# Patient Record
Sex: Female | Born: 1985 | Race: White | Hispanic: No | Marital: Single | Smoking: Never smoker
Health system: Southern US, Community
[De-identification: ages and names within clinical notes are randomized; demographics above are authoritative.]

## PROBLEM LIST (undated history)

## (undated) ENCOUNTER — Inpatient Hospital Stay (HOSPITAL_COMMUNITY): Payer: Self-pay

## (undated) DIAGNOSIS — F419 Anxiety disorder, unspecified: Secondary | ICD-10-CM

## (undated) HISTORY — PX: OOPHORECTOMY: SHX86

---
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
## 2008-08-19 ENCOUNTER — Inpatient Hospital Stay (HOSPITAL_COMMUNITY): Admission: AD | Admit: 2008-08-19 | Discharge: 2008-08-19 | Payer: Self-pay | Admitting: Obstetrics and Gynecology

## 2008-08-27 ENCOUNTER — Inpatient Hospital Stay (HOSPITAL_COMMUNITY): Admission: RE | Admit: 2008-08-27 | Discharge: 2008-08-29 | Payer: Self-pay | Admitting: Obstetrics & Gynecology

## 2008-08-30 ENCOUNTER — Encounter: Admission: RE | Admit: 2008-08-30 | Discharge: 2008-09-29 | Payer: Self-pay | Admitting: Obstetrics and Gynecology

## 2008-09-30 ENCOUNTER — Encounter: Admission: RE | Admit: 2008-09-30 | Discharge: 2008-10-15 | Payer: Self-pay | Admitting: Obstetrics and Gynecology

## 2010-03-27 ENCOUNTER — Ambulatory Visit (HOSPITAL_COMMUNITY)
Admission: RE | Admit: 2010-03-27 | Discharge: 2010-03-27 | Disposition: A | Discharge status: Home / Self Care | Payer: BC Managed Care – PPO | Source: Ambulatory Visit | Attending: Emergency Medicine | Admitting: Emergency Medicine

## 2010-03-27 ENCOUNTER — Emergency Department (HOSPITAL_COMMUNITY)
Admission: EM | Admit: 2010-03-27 | Discharge: 2010-03-27 | Disposition: A | Discharge status: Home / Self Care | Payer: BC Managed Care – PPO | Attending: Emergency Medicine | Admitting: Emergency Medicine

## 2010-03-27 DIAGNOSIS — S0003XA Contusion of scalp, initial encounter: Secondary | ICD-10-CM | POA: Insufficient documentation

## 2010-03-27 DIAGNOSIS — M79609 Pain in unspecified limb: Secondary | ICD-10-CM | POA: Insufficient documentation

## 2010-03-27 DIAGNOSIS — S8000XA Contusion of unspecified knee, initial encounter: Secondary | ICD-10-CM | POA: Insufficient documentation

## 2010-03-27 DIAGNOSIS — S60229A Contusion of unspecified hand, initial encounter: Secondary | ICD-10-CM | POA: Insufficient documentation

## 2010-03-27 DIAGNOSIS — M7989 Other specified soft tissue disorders: Secondary | ICD-10-CM | POA: Insufficient documentation

## 2010-03-27 DIAGNOSIS — S0180XA Unspecified open wound of other part of head, initial encounter: Secondary | ICD-10-CM | POA: Insufficient documentation

## 2010-03-27 LAB — URINALYSIS, ROUTINE W REFLEX MICROSCOPIC
Bilirubin Urine: NEGATIVE
Glucose, UA: NEGATIVE mg/dL
Hgb urine dipstick: NEGATIVE
Protein, ur: NEGATIVE mg/dL
Specific Gravity, Urine: 1.015 (ref 1.005–1.030)
pH: 5.5 (ref 5.0–8.0)

## 2010-04-23 LAB — CBC
HCT: 32.1 % — ABNORMAL LOW (ref 36.0–46.0)
Hemoglobin: 8.8 g/dL — ABNORMAL LOW (ref 12.0–15.0)
MCHC: 34.2 g/dL (ref 30.0–36.0)
MCV: 84.6 fL (ref 78.0–100.0)
Platelets: 191 10*3/uL (ref 150–400)
RBC: 3.79 MIL/uL — ABNORMAL LOW (ref 3.87–5.11)
RDW: 14 % (ref 11.5–15.5)
RDW: 14.2 % (ref 11.5–15.5)
WBC: 8.4 10*3/uL (ref 4.0–10.5)

## 2010-06-03 NOTE — H&P (Signed)
NAMEJAMIRRA, [name]                        ACCOUNT NO.:  [id number]   MEDICAL RECORD NO.:  [id number]                   PATIENT TYPE:  IPS   LOCATION:  0102                                 FACILITY:  [facility]   PHYSICIAN:  Cindie Crumbly, M.D.               DATE OF BIRTH:  1985-12-28   DATE OF ADMISSION:  11/08/2001  DATE OF DISCHARGE:                         PSYCHIATRIC ADMISSION ASSESSMENT   REASON FOR ADMISSION:  This 25 year old white female was admitted  complaining of depression status post overdose as a suicide attempt.  She  complains of continued suicidal ideation.  She states she does not know why  she overdosed and states that she does not want to live.   HISTORY OF PRESENT ILLNESS:  The patient complains of an increasingly  depressed, irritable and anxious mood most of the day, nearly every day,  over the past month along with psychomotor agitation, poor impulse control,  decreased concentration and energy level, increased symptoms of fatigue,  excessive and inappropriate guilt, giving up on activities previously found  pleasurable, decreased school performance, 2-3 pound weight loss over the  past 1-[redacted] weeks along with decreased appetite, insomnia, feelings of  hopelessness, helplessness, worthlessness, decreased interest in activities  that she previously enjoyed.  She is unable to contract for safety at this  time.   PAST PSYCHIATRIC HISTORY:  An episode of major depression in December of  2001.  At that point in time, she was hospitalized as an inpatient at [facility] for a period of four days.  At that time, she  had overdosed on sleeping pills as a suicide attempt.  She was started on  Celexa and took the medication for a period of only 10 days.  She reports  that she discontinued the medication because of nausea and has had no other  trials of psychotropic medications.  The patient also has a history of  oppositional defiant disorder.   She denies any other psychiatric history.   PAST MEDICAL HISTORY:  She denies any other medical or surgical problems.   SUBSTANCE ABUSE HISTORY:  She denies the use of tobacco, alcohol or street  drugs.   ALLERGIES:  She has no known drug allergies or sensitivities.   MEDICATIONS:  She is on no current medications.   STRENGTHS/ASSETS:  Her mother is supportive of her.   FAMILY/SOCIAL HISTORY:  The patient lives with her mother and maternal  grandmother.  Father has had no contact with the patient since the ninth  grade.  Mother has a history of major depression and borderline personality  disorder.  The patient is currently in the 11th grade and she reports that  she has average grades but that they have been decreasing over the past  several weeks.   MENTAL STATUS EXAM:  The patient presents as a well-developed, well-  nourished, adolescent white female who is alert, oriented x 4, psychomotor  agitated, disheveled and unkempt and whose appearance is compatible with her  stated age.  Her speech is coherent with a decreased rate and volume of  speech, increased speech latency.  She displays no looseness of  associations, phonemic errors or evidence of a thought disorder.  Her  immediate recall, short-term memory and remote memory are intact.  Similarities and differences are within normal limits and she is able to  abstract to proverbs.  Her affect and mood are depressed, irritable and  anxious.  Her thought processes are goal directed.   DIAGNOSES (ACCORDING TO DSM-IV):   AXIS I:  1. Major depression, recurrent, severe without psychosis.  2. Oppositional defiant disorder.   AXIS II:  Rule out personality disorder not otherwise specified.   AXIS III:  None.   AXIS IV:  Severe.   AXIS V:  20.   ESTIMATED LENGTH OF STAY:  Five to seven days.   INITIAL DISCHARGE PLAN:  Discharge the patient to home.   INITIAL PLAN OF CARE:  Begin the patient on a trial of Remeron once  informed  consent is obtained and the risks/benefits discussion has been held.  Psychotherapy will focus on improving the patient's impulse control,  decreasing cognitive distortions and potential for self-harm.  A laboratory  workup will also be initiated to rule out any other medical problems  contributing to her symptomatology.                                               Cindie Crumbly, M.D.    TS/MEDQ  D:  11/08/2001  T:  11/08/2001  Job:  [id number]

## 2010-06-03 NOTE — H&P (Signed)
NAMEANNABEL, GIBEAU              ACCOUNT NO.:  [id number]   MEDICAL RECORD NO.:  [id number]          PATIENT TYPE:  AMB   LOCATION:  DAY                           FACILITY:  [facility]   PHYSICIAN:  Lazaro Arms, M.D.   DATE OF BIRTH:  1985-01-25   DATE OF ADMISSION:  03/23/2004  DATE OF DISCHARGE:  LH                                HISTORY & PHYSICAL   HISTORY OF PRESENT ILLNESS:  [name] is an 25 year old white female, gravida  1, para 0, with last menstrual period of January 07, 2004, giving an  estimated date of delivery of October 13, 2004.  She came into the office  originally on February 29, 2004 for her first OB visit, and an ultrasound at  that time revealed a 7 week, 4 day intrauterine pregnancy with positive  fetal cardiac activity.  There was no problems at that time at that time.  She came in for a routine visit on March 21, 2004, at which time I did her  first visit exam with Pap smear and cultures, and her uterus was severely  retroverted, and I knew I would not be able to hear the heart beat.  At 10  weeks and 4 days, with a retroverted uterus, we did an ultrasound.  The  patient had not been having a bleeding or any other problems.  At that time,  I saw an 8.5 week crown-rump length, and no fetal cardiac activity was seen.  I had the ultrasound confirmed at [facility] independently, and  they also confirmed a missed AB in the first trimester.  As a result of  these findings, talking it over with Morrie Sheldon and her family, she decided to  go forward with a D&C.   PAST MEDICAL HISTORY:  Bronchitis.   PAST SURGICAL HISTORY:  Negative.   PAST OBSTETRICAL HISTORY:  Negative.   ALLERGIES:  None.   MEDICATIONS:  Prenatal vitamins.   REVIEW OF SYSTEMS:  Otherwise negative.   LABORATORY DATA:  Blood work is pending from the office, which was drawn on  March 21, 2004.   PHYSICAL EXAMINATION:  HEENT:  Unremarkable.  NECK:  Thyroid was normal.  LUNGS:  Clear.  HEART:  Regular rate and rhythm without murmur, rub, or gallop.  BREASTS:  Normal.  No masses, discharge, or skin changes.  ABDOMEN:  Benign.  No hepatosplenomegaly or masses.  PELVIC:  She has normal external genitalia.  The vagina is pink and moist  without discharge.  Cervix is nulliparous without lesions.  Uterus severely  retroverted and retroflexed.  EXTREMITIES:  Warm with no edema.  NEUROLOGIC:  Grossly intact.   IMPRESSION:  Missed abortion in the first trimester.   PLAN:  The patient is admitted for a cervical dilatation and uterine  curettage.  She understands the risks, benefits, indications, and  alternatives, and will proceed.      LHE/MEDQ  D:  03/22/2004  T:  03/22/2004  Job:  [id number]

## 2010-06-03 NOTE — Op Note (Signed)
NAMEJAHNA, [name]              ACCOUNT NO.:  [id number]   MEDICAL RECORD NO.:  [id number]          PATIENT TYPE:  AMB   LOCATION:  DAY                           FACILITY:  [facility]   PHYSICIAN:  Lazaro Arms, M.D.   DATE OF BIRTH:  22-Oct-1985   DATE OF PROCEDURE:  03/23/2004  DATE OF DISCHARGE:                                 OPERATIVE REPORT   PREOPERATIVE DIAGNOSES:  1.  Intrauterine pregnancy at 10-1/2 weeks.  2.  Missed abortion.   POSTOPERATIVE DIAGNOSES:  1.  Intrauterine pregnancy at 10-1/2 weeks.  2.  Missed abortion.   PROCEDURE:  Cervical dilation with uterine curettage.   SURGEON:  Lazaro Arms, M.D.   ANESTHESIA:  Laryngeal mask airway.   FINDINGS:  The patient came to the office on the 6th for routine visit.  She  has a very retroverted, retroflexed uterus.  Could not hear the fetal heart  tones and so I did an ultrasound, which revealed an 8-1/2 week crown-rump  length with no cardiac activity.  She had been seen three weeks prior, had  positive cardiac activity seen at that time.  This was confirmed by an  independent ultrasound over here at [facility].  As a result, the patient  decided to undergo a D&C.   DESCRIPTION OF OPERATION:  The patient was taken to the operating room and  placed in the supine position, where she underwent laryngeal mask airway.  She was placed in the dorsal lithotomy position, prepped and draped in the  usual sterile fashion.  A speculum was placed, the cervix was grasped.  A  paracervical block was placed.  The cervix was dilated serially to allow  passage of a 9 curved suction curette.  Endometrial suction curettage was  performed with four passes.  Then a gentle sharp curettage was performed and  all tissue was removed.  Another gentle suction curettage was performed  again.  She does have an extremely retroflexed and retroverted uterus,  almost an inverted C.  The patient tolerated the procedure well.  She  experienced no  blood loss and was taken to the recovery room in good, stable  condition.  All counts were correct x3.  She received Ancef and Toradol  prophylactically.    LHE/MEDQ  D:  03/23/2004  T:  03/23/2004  Job:  [id number]

## 2010-06-03 NOTE — Discharge Summary (Signed)
[name], [name]                        ACCOUNT NO.:  [id number]   MEDICAL RECORD NO.:  [id number]                   PATIENT TYPE:  INP   LOCATION:  0102                                 [facility]   PHYSICIAN:  Cindie Crumbly, M.D.               DATE OF BIRTH:  [birth year]   DATE OF ADMISSION:  11/08/2001  DATE OF DISCHARGE:  11/12/2001                                 DISCHARGE SUMMARY   REASON FOR ADMISSION:  This 25 year old white female was admitted  complaining of depression status post overdose as a suicide attempt.  For  further history of present illness, please see the patient's psychiatric  admission assessment.   PHYSICAL EXAMINATION:  Physical examination at the time of admission was  entirely unremarkable.   LABORATORY DATA:  The patient underwent a laboratory workup to rule out any  medical problems contributing to her symptomatology.  Hepatic panel was  within normal limits.  Chemistry panel showed a serum potassium 3.4 and was  otherwise unremarkable.  Free T4 and TSH were unremarkable.  GGT was within  normal limits.  RPR was nonreactive.  Urine probe for gonorrhea and  Chlamydia are pending at the time of discharge.  Hepatic panel was within  normal limits.  Further laboratory evaluation was done at [facility] where the patient was stabilized prior to transfer to this  facility.  The patient received no x-rays, no special procedures, no  additional consultations.  She sustained no complications during the course  of this hospitalization.   HOSPITAL COURSE:  On admission to the unit, she was disheveled and unkempt.  Affect and mood were sad, depressed, and anxious.  Her concentration was  decreased.  She displayed poor impulse control.  She was begun on a trial of  Remeron, tolerated this medication well without side effects.  At the time  of discharge, she denies any homicidal or suicidal ideation, her affect and  mood have improved,  concentration is increased, she is actively  participating in all aspects of the therapeutic treatment program, no longer  appears to be danger to herself or others and consequently is felt to have  reached her maximum benefits of hospitalization and is ready for discharge  to a less restrictive alternative setting.   CONDITION ON DISCHARGE:  Her condition on discharge is improved.   DIAGNOSES:  Diagnoses according to DSM-IV:   AXIS I:  1. Major depression, recurrent, severe without psychosis.  2. Oppositional defiant disorder.   AXIS II:  Rule out personality disorder, not otherwise specified.   AXIS III:  None.   AXIS IV:  Severe.   AXIS V:  20 on admission, 30 on discharge.   FURTHER EVALUATION AND TREATMENT RECOMMENDATIONS:  1. The patient is discharged to home.  2. She is discharged on an unrestricted level of activity and a regular     diet.  3. She is discharged  on Remeron 15 mg p.o. q.h.s.  4. She will follow up with Dr. Omelia Blackwater, her outpatient psychiatrist, for all     further aspects of her psychiatric care and consequently, I will sign off     on the case at this time.                                               Cindie Crumbly, M.D.    TS/MEDQ  D:  11/12/2001  T:  11/12/2001  Job:  [id number]

## 2011-05-02 LAB — OB RESULTS CONSOLE ABO/RH: "RH Type ": POSITIVE

## 2011-05-02 LAB — OB RESULTS CONSOLE ANTIBODY SCREEN: Antibody Screen: NEGATIVE

## 2011-05-02 LAB — OB RESULTS CONSOLE HEPATITIS B SURFACE ANTIGEN: Hepatitis B Surface Ag: NEGATIVE

## 2011-05-02 LAB — OB RESULTS CONSOLE HIV ANTIBODY (ROUTINE TESTING): HIV: NONREACTIVE

## 2011-05-02 LAB — OB RESULTS CONSOLE RPR: RPR: NONREACTIVE

## 2011-05-02 LAB — OB RESULTS CONSOLE RUBELLA ANTIBODY, IGM: Rubella: IMMUNE

## 2011-09-14 ENCOUNTER — Encounter (HOSPITAL_COMMUNITY): Payer: Self-pay | Admitting: *Deleted

## 2011-09-14 ENCOUNTER — Inpatient Hospital Stay (HOSPITAL_COMMUNITY)
Admission: AD | Admit: 2011-09-14 | Discharge: 2011-09-14 | Disposition: A | Discharge status: Home / Self Care | Payer: BC Managed Care – PPO | Source: Ambulatory Visit | Attending: Obstetrics and Gynecology | Admitting: Obstetrics and Gynecology

## 2011-09-14 DIAGNOSIS — O26879 Cervical shortening, unspecified trimester: Secondary | ICD-10-CM

## 2011-09-14 DIAGNOSIS — O368390 Maternal care for abnormalities of the fetal heart rate or rhythm, unspecified trimester, not applicable or unspecified: Secondary | ICD-10-CM | POA: Insufficient documentation

## 2011-09-14 DIAGNOSIS — O4700 False labor before 37 completed weeks of gestation, unspecified trimester: Secondary | ICD-10-CM | POA: Insufficient documentation

## 2011-09-14 DIAGNOSIS — O30009 Twin pregnancy, unspecified number of placenta and unspecified number of amniotic sacs, unspecified trimester: Secondary | ICD-10-CM | POA: Insufficient documentation

## 2011-09-14 MED ORDER — BETAMETHASONE SOD PHOS & ACET 6 (3-3) MG/ML IJ SUSP
12.0000 mg | Freq: Once | INTRAMUSCULAR | Status: AC
  Administered 2011-09-14: 12 mg via INTRAMUSCULAR
  Filled 2011-09-14: qty 2

## 2011-09-14 NOTE — MAU Note (Signed)
Twin gest.  Sent from office for PTL eval.

## 2011-09-14 NOTE — MAU Provider Note (Signed)
  History     CSN: [id number]  Arrival date and time: 09/14/11 1529   First Provider Initiated Contact with Patient 09/14/11 1625      Chief Complaint  Patient presents with  . Labor Eval   HPI 26 y.o. G4P1011 at [redacted]w[redacted]d with twins sent for monitoring and betamethasone from office d/t short cervix and concern for preterm labor. Pt denies contractions, vaginal bleeding, LOF. + fetal movement.     Past Medical History  Diagnosis Date  . Preterm labor     Past Surgical History  Procedure Date  . Dilation and curettage of uterus 2006    Family History  Problem Relation Age of Onset  . Alcohol abuse Father   . Cancer Father   . Early death Father     History  Substance Use Topics  . Smoking status: Not on file  . Smokeless tobacco: Not on file  . Alcohol Use: No    Allergies:  Allergies  Allergen Reactions  . Latex Itching    Prescriptions prior to admission  Medication Sig Dispense Refill  . calcium carbonate (TUMS - DOSED IN MG ELEMENTAL CALCIUM) 500 MG chewable tablet Chew 1 tablet by mouth 2 (two) times daily as needed. For heartburn        Review of Systems  Constitutional: Negative.   Respiratory: Negative.   Cardiovascular: Negative.   Gastrointestinal: Negative for nausea, vomiting, abdominal pain, diarrhea and constipation.  Genitourinary: Negative for dysuria, urgency, frequency, hematuria and flank pain.       Negative for vaginal bleeding, cramping/contractions  Musculoskeletal: Negative.   Neurological: Negative.   Psychiatric/Behavioral: Negative.    Physical Exam   Blood pressure 115/66, pulse 111, temperature 97.4 F (36.3 C), temperature source Oral, resp. rate 20, height 5\' 6"  (1.676 m), weight 150 lb (68.04 kg).  Physical Exam  Nursing note and vitals reviewed. Constitutional: She is oriented to person, place, and time. She appears well-developed and well-nourished. No distress.  Cardiovascular: Normal rate.   Respiratory: Effort  normal.  GI: Soft. There is no tenderness.  Musculoskeletal: Normal range of motion.  Neurological: She is alert and oriented to person, place, and time.  Skin: Skin is warm and dry.  Psychiatric: She has a normal mood and affect.    MAU Course  Procedures EFM reactive x 2, TOCO: initially a few irregular UCs, then quiet  Scheduled Meds:   . betamethasone acetate-betamethasone sodium phosphate  12 mg Intramuscular Once   Continuous Infusions:  PRN Meds:.    Assessment and Plan   1. Threatened preterm labor   2. Short cervix affecting pregnancy    Return tomorrow for 2nd dose of BMZ Bedrest, preterm labor precautions rev'd  FRAZIER,NATALIE 09/14/2011, 4:29 PM

## 2011-09-15 ENCOUNTER — Inpatient Hospital Stay (HOSPITAL_COMMUNITY)
Admission: AD | Admit: 2011-09-15 | Discharge: 2011-09-15 | Disposition: A | Discharge status: Home / Self Care | Payer: BC Managed Care – PPO | Source: Ambulatory Visit | Attending: Obstetrics and Gynecology | Admitting: Obstetrics and Gynecology

## 2011-09-15 DIAGNOSIS — O4700 False labor before 37 completed weeks of gestation, unspecified trimester: Secondary | ICD-10-CM | POA: Insufficient documentation

## 2011-09-15 MED ORDER — BETAMETHASONE SOD PHOS & ACET 6 (3-3) MG/ML IJ SUSP
12.0000 mg | Freq: Once | INTRAMUSCULAR | Status: AC
  Administered 2011-09-15: 12 mg via INTRAMUSCULAR
  Filled 2011-09-15: qty 2

## 2011-10-12 ENCOUNTER — Ambulatory Visit (INDEPENDENT_AMBULATORY_CARE_PROVIDER_SITE_OTHER): Payer: BC Managed Care – PPO | Admitting: *Deleted

## 2011-10-12 VITALS — BP 109/69 | Wt 155.6 lb

## 2011-10-12 DIAGNOSIS — O26879 Cervical shortening, unspecified trimester: Secondary | ICD-10-CM

## 2011-10-12 DIAGNOSIS — O30009 Twin pregnancy, unspecified number of placenta and unspecified number of amniotic sacs, unspecified trimester: Secondary | ICD-10-CM | POA: Insufficient documentation

## 2011-10-12 NOTE — Progress Notes (Signed)
P = 102  Copy of NST report and tracing sent to Dr. Arelia Sneddon w/pt today.

## 2011-10-19 ENCOUNTER — Encounter (HOSPITAL_COMMUNITY): Payer: Self-pay | Admitting: *Deleted

## 2011-10-19 ENCOUNTER — Inpatient Hospital Stay (HOSPITAL_COMMUNITY)
Admission: AD | Admit: 2011-10-19 | Discharge: 2011-10-27 | DRG: 371 | Disposition: A | Discharge status: Home / Self Care | Payer: BC Managed Care – PPO | Source: Ambulatory Visit | Attending: Obstetrics and Gynecology | Admitting: Obstetrics and Gynecology

## 2011-10-19 ENCOUNTER — Ambulatory Visit (INDEPENDENT_AMBULATORY_CARE_PROVIDER_SITE_OTHER): Payer: BC Managed Care – PPO | Admitting: *Deleted

## 2011-10-19 VITALS — BP 110/63 | Wt 158.0 lb

## 2011-10-19 DIAGNOSIS — O99893 Other specified diseases and conditions complicating puerperium: Secondary | ICD-10-CM | POA: Diagnosis not present

## 2011-10-19 DIAGNOSIS — O26879 Cervical shortening, unspecified trimester: Secondary | ICD-10-CM

## 2011-10-19 DIAGNOSIS — Z302 Encounter for sterilization: Secondary | ICD-10-CM

## 2011-10-19 DIAGNOSIS — O329XX0 Maternal care for malpresentation of fetus, unspecified, not applicable or unspecified: Principal | ICD-10-CM | POA: Diagnosis present

## 2011-10-19 DIAGNOSIS — O30009 Twin pregnancy, unspecified number of placenta and unspecified number of amniotic sacs, unspecified trimester: Secondary | ICD-10-CM

## 2011-10-19 DIAGNOSIS — O3090 Multiple gestation, unspecified, unspecified trimester: Principal | ICD-10-CM | POA: Diagnosis present

## 2011-10-19 DIAGNOSIS — R51 Headache: Secondary | ICD-10-CM | POA: Diagnosis not present

## 2011-10-19 LAB — CBC
MCH: 27.8 pg (ref 26.0–34.0)
MCHC: 32.5 g/dL (ref 30.0–36.0)
Platelets: 270 10*3/uL (ref 150–400)

## 2011-10-19 MED ORDER — CALCIUM CARBONATE ANTACID 500 MG PO CHEW: 2.0000 | CHEWABLE_TABLET | ORAL | Status: DC | PRN

## 2011-10-19 MED ORDER — DOCUSATE SODIUM 100 MG PO CAPS
100.0000 mg | ORAL_CAPSULE | Freq: Every day | ORAL | Status: DC
  Administered 2011-10-21 – 2011-10-22 (×2): 100 mg via ORAL
  Filled 2011-10-19 (×3): qty 1

## 2011-10-19 MED ORDER — MAGNESIUM SULFATE 40 G IN LACTATED RINGERS - SIMPLE
2.5000 g/h | INTRAVENOUS | Status: DC
  Administered 2011-10-19: 3 g/h via INTRAVENOUS
  Administered 2011-10-19: 2 g/h via INTRAVENOUS
  Administered 2011-10-21: 3 g/h via INTRAVENOUS
  Filled 2011-10-19 (×2): qty 500

## 2011-10-19 MED ORDER — ZOLPIDEM TARTRATE 5 MG PO TABS
5.0000 mg | ORAL_TABLET | Freq: Every evening | ORAL | Status: DC | PRN
  Administered 2011-10-19: 5 mg via ORAL
  Filled 2011-10-19: qty 1

## 2011-10-19 MED ORDER — ACETAMINOPHEN 325 MG PO TABS
650.0000 mg | ORAL_TABLET | ORAL | Status: DC | PRN
  Administered 2011-10-19 – 2011-10-20 (×4): 650 mg via ORAL
  Filled 2011-10-19 (×3): qty 2

## 2011-10-19 MED ORDER — LACTATED RINGERS IV SOLN
INTRAVENOUS | Status: DC
  Administered 2011-10-19 – 2011-10-23 (×8): via INTRAVENOUS

## 2011-10-19 MED ORDER — PRENATAL MULTIVITAMIN CH
1.0000 | ORAL_TABLET | Freq: Every day | ORAL | Status: DC
  Administered 2011-10-22: 1 via ORAL
  Filled 2011-10-19 (×3): qty 1

## 2011-10-19 MED ORDER — MAGNESIUM SULFATE BOLUS VIA INFUSION
4.0000 g | Freq: Once | INTRAVENOUS | Status: DC
  Filled 2011-10-19 (×3): qty 500

## 2011-10-19 NOTE — Plan of Care (Signed)
Problem: Consults Goal: Birthing Suites Patient Information Press F2 to bring up selections list   Pt < [redacted] weeks EGA     

## 2011-10-19 NOTE — H&P (Signed)
[name] is a 26 y.o. female presenting at 26 weeks sent from office for cx changes on sono.  Cervix 0.5 cm no dilation  Did have funneling. Maternal Medical History:  Prenatal Complications - Diabetes: none.    OB History    Grav Para Term Preterm Abortions TAB SAB Ect Mult Living   4 1 1  0 1 0 1 0  1     Past Medical History  Diagnosis Date  . Preterm labor    Past Surgical History  Procedure Date  . Dilation and curettage of uterus 2006   Family History: family history includes Alcohol abuse in her father; Cancer in her father; and Early death in her father. Social History:  does not have a smoking history on file. She does not have any smokeless tobacco history on file. She reports that she does not drink alcohol or use illicit drugs.   Prenatal Transfer Tool  Maternal Diabetes: No Genetic Screening: Declined Maternal Ultrasounds/Referrals: Normal Fetal Ultrasounds or other Referrals:  None Maternal Substance Abuse:  No Significant Maternal Medications:  None Significant Maternal Lab Results:  None Other Comments:  None  Review of Systems  All other systems reviewed and are negative.      There were no vitals taken for this visit. Maternal Exam:  Abdomen: Patient reports no abdominal tenderness. Fundal height is twins.       Physical Exam  Constitutional: She is oriented to person, place, and time. She appears well-developed and well-nourished.  Cardiovascular: Normal rate, regular rhythm and normal heart sounds.   Respiratory: Effort normal and breath sounds normal.  Genitourinary:       sono with cervical thinning and funneling  Neurological: She is alert and oriented to person, place, and time. She has normal reflexes.    Prenatal labs: ABO, Rh:   Antibody:   Rubella:   RPR:    HBsAg:    HIV:    GBS:     Assessment/Plan: Twins at 33 weeks with sonographic cervical thinning and funneling admitt bedrest and monitoring.  Already received  betamethasone   Sarabi Sockwell S 10/19/2011, 5:06 PM

## 2011-10-19 NOTE — Progress Notes (Signed)
Patient ID: [name], female   DOB: [birth year], 26 y.o.   MRN: [id number] On exam  Cervix 3-4 cm 50 % fhr reactive no decel Ctx q 2 minutes now spacing out on magnesium sulfate sono infant a breech infant b transverse Discussed prematurity and need for cesarean section if labor progresses or rom

## 2011-10-19 NOTE — Progress Notes (Signed)
Patient ID: [name], female   DOB: [birth year], 26 y.o.   MRN: [id number] C/o increased cts Monitor irreg  Cervix  No change  Increase magnesium

## 2011-10-19 NOTE — Plan of Care (Signed)
Problem: Phase II Progression Outcomes Goal: Mattress in use Mattress in use ( [employer]crate, Med/Surg, Regular, Birthing Suite, Other)  Outcome: Completed/Met Date Met:  10/19/11 Birthing Suites

## 2011-10-19 NOTE — Progress Notes (Signed)
P = 98   Copy of report and tracing sent to Dr. Rana Snare w/pt today

## 2011-10-20 LAB — RPR: RPR Ser Ql: NONREACTIVE

## 2011-10-20 MED ORDER — ZOLPIDEM TARTRATE 5 MG PO TABS
10.0000 mg | ORAL_TABLET | Freq: Every evening | ORAL | Status: DC | PRN
  Administered 2011-10-20: 10 mg via ORAL
  Filled 2011-10-20: qty 2

## 2011-10-20 MED ORDER — BUTALBITAL-APAP-CAFFEINE 50-325-40 MG PO TABS
1.0000 | ORAL_TABLET | Freq: Four times a day (QID) | ORAL | Status: DC | PRN
  Administered 2011-10-20: 1 via ORAL
  Filled 2011-10-20: qty 1

## 2011-10-20 NOTE — Progress Notes (Signed)
fhr monitors x 2 removed

## 2011-10-20 NOTE — Progress Notes (Signed)
Patient rested ok last night. Contractions have decreased in frequency.  Afebrile Vital signs stable Tocometer contractions now 2 in past hour Fetal heart rate A and B reactive  IMPRESSION: PTL Twins Breech Twin A  PLAN: Patient is status post steroids Contractions have responded to the Magnesium Continue Magnesium at 3 gram per hour for now Consider decreasing dosage tomorrow Would need C Section for Delivery.

## 2011-10-20 NOTE — Progress Notes (Signed)
This RN started care of pt

## 2011-10-21 MED ORDER — MAGNESIUM SULFATE 40 G IN LACTATED RINGERS - SIMPLE
3.0000 g/h | INTRAVENOUS | Status: DC
  Administered 2011-10-21 – 2011-10-23 (×3): 3 g/h via INTRAVENOUS
  Filled 2011-10-21 (×3): qty 500

## 2011-10-21 MED ORDER — ZOLPIDEM TARTRATE 5 MG PO TABS: 5.0000 mg | ORAL_TABLET | Freq: Every evening | ORAL | Status: DC | PRN

## 2011-10-21 NOTE — Progress Notes (Signed)
Patient rested well overnight. She is sleeping now but tells me she feels [facility] better. Afebrile Vital signs stable toco no contractions  Abdomen is soft and non tender  IMPRESSION: TWIP IUP at 33 w 2 days PTL Breech for Twin A  PLAN: Decrease magnesium to 2.5 gram /hr Shower c section for delivery

## 2011-10-21 NOTE — Progress Notes (Signed)
1815- pt's husband came out to the desk and said that the pt is having a lot of pressure in her vaginal area. He said that she wanted to be examined. Dr. Thana Ates notified. Says to check the pt's cervix.

## 2011-10-21 NOTE — Progress Notes (Signed)
1240-dr. grewel notified of uc's. Orders recived to increase magnesium sulfate to 3gm/hr.

## 2011-10-21 NOTE — Progress Notes (Signed)
Pt called out with complaints of "weird feeling lower in pelvis" quarter size mucus spot on pad. RN will continue to monitor. Toco in place

## 2011-10-21 NOTE — Progress Notes (Signed)
1051- pt received from labor and delivery

## 2011-10-22 MED ORDER — HYDROCORTISONE 2.5 % EX CREA
TOPICAL_CREAM | Freq: Two times a day (BID) | CUTANEOUS | Status: DC | PRN
  Administered 2011-10-22: 20:00:00 via TOPICAL
  Filled 2011-10-22: qty 20

## 2011-10-22 MED ORDER — DIPHENHYDRAMINE HCL 25 MG PO CAPS
25.0000 mg | ORAL_CAPSULE | Freq: Once | ORAL | Status: AC
  Administered 2011-10-22: 25 mg via ORAL
  Filled 2011-10-22: qty 1

## 2011-10-22 MED ORDER — HYDROCORTISONE 2.5 % RE CREA: TOPICAL_CREAM | RECTAL | Status: DC | PRN

## 2011-10-22 NOTE — Progress Notes (Signed)
Dr. Vincente Poli notified of patient status including abdominal itching and request for different sleep medication.  New orders received.  Patient advised of plan of care and all questions were answered.

## 2011-10-22 NOTE — Progress Notes (Signed)
RN at bedside-  Pt not feeling anything. Pt feeling fine at this time

## 2011-10-22 NOTE — Progress Notes (Signed)
Yesterday, patient had more notable contractions after decreasing the Magnesium to 2.5 gram. Cervix in the afternoon was unchanged. I increased to 3 grams of Mag and she has since felt much better. Afebrile Vital signs stable General alert and oriented Abdomen is soft and non tender  IMPRESSION: TWIN IUP at 33 w 3 days PTL  PLAN C Section for delivery Continue the magnesium for now Discuss with Dr. Rana Snare her primary tomorrow delivery plan Discussed with patient that we should try to delay delivery until at least 35 week

## 2011-10-23 ENCOUNTER — Encounter (HOSPITAL_COMMUNITY)
Admission: AD | Disposition: A | Discharge status: Home / Self Care | Payer: Self-pay | Source: Ambulatory Visit | Attending: Obstetrics and Gynecology

## 2011-10-23 ENCOUNTER — Encounter (HOSPITAL_COMMUNITY): Payer: Self-pay

## 2011-10-23 ENCOUNTER — Encounter (HOSPITAL_COMMUNITY): Payer: Self-pay | Admitting: Anesthesiology

## 2011-10-23 ENCOUNTER — Observation Stay (HOSPITAL_COMMUNITY): Payer: BC Managed Care – PPO | Admitting: Anesthesiology

## 2011-10-23 LAB — TYPE AND SCREEN
ABO/RH(D): A POS
Antibody Screen: NEGATIVE
Unit division: 0
Unit division: 0

## 2011-10-23 LAB — CBC
HCT: 28.5 % — ABNORMAL LOW (ref 36.0–46.0)
Hemoglobin: 9 g/dL — ABNORMAL LOW (ref 12.0–15.0)
MCH: 26.6 pg (ref 26.0–34.0)
MCHC: 31.6 g/dL (ref 30.0–36.0)
MCV: 84.3 fL (ref 78.0–100.0)
Platelets: 288 K/uL (ref 150–400)
RBC: 3.38 MIL/uL — ABNORMAL LOW (ref 3.87–5.11)
RDW: 14.3 % (ref 11.5–15.5)
WBC: 13.5 K/uL — ABNORMAL HIGH (ref 4.0–10.5)

## 2011-10-23 SURGERY — Surgical Case
Anesthesia: Spinal

## 2011-10-23 MED ORDER — ACETAMINOPHEN 10 MG/ML IV SOLN
1000.0000 mg | Freq: Four times a day (QID) | INTRAVENOUS | Status: AC | PRN
  Administered 2011-10-23: 1000 mg via INTRAVENOUS
  Filled 2011-10-23 (×2): qty 100

## 2011-10-23 MED ORDER — OXYCODONE-ACETAMINOPHEN 5-325 MG PO TABS
1.0000 | ORAL_TABLET | ORAL | Status: DC | PRN
  Administered 2011-10-24: 1 via ORAL
  Administered 2011-10-24 (×3): 2 via ORAL
  Administered 2011-10-24 – 2011-10-25 (×2): 1 via ORAL
  Administered 2011-10-25: 2 via ORAL
  Administered 2011-10-25 – 2011-10-26 (×4): 1 via ORAL
  Administered 2011-10-26 (×3): 2 via ORAL
  Administered 2011-10-27: 1 via ORAL
  Administered 2011-10-27: 2 via ORAL
  Filled 2011-10-23 (×2): qty 2
  Filled 2011-10-23 (×2): qty 1
  Filled 2011-10-23: qty 2
  Filled 2011-10-23 (×3): qty 1
  Filled 2011-10-23: qty 2
  Filled 2011-10-23 (×2): qty 1
  Filled 2011-10-23 (×3): qty 2
  Filled 2011-10-23: qty 1
  Filled 2011-10-23: qty 2
  Filled 2011-10-23: qty 1

## 2011-10-23 MED ORDER — PHENYLEPHRINE 40 MCG/ML (10ML) SYRINGE FOR IV PUSH (FOR BLOOD PRESSURE SUPPORT)
PREFILLED_SYRINGE | INTRAVENOUS | Status: AC
  Filled 2011-10-23: qty 5

## 2011-10-23 MED ORDER — ONDANSETRON HCL 4 MG/2ML IJ SOLN
INTRAMUSCULAR | Status: AC
  Filled 2011-10-23: qty 2

## 2011-10-23 MED ORDER — MIDAZOLAM HCL 2 MG/2ML IJ SOLN: 0.5000 mg | Freq: Once | INTRAMUSCULAR | Status: DC | PRN

## 2011-10-23 MED ORDER — LANOLIN HYDROUS EX OINT: 1.0000 "application " | TOPICAL_OINTMENT | CUTANEOUS | Status: DC | PRN

## 2011-10-23 MED ORDER — MORPHINE SULFATE (PF) 0.5 MG/ML IJ SOLN
INTRAMUSCULAR | Status: DC | PRN
  Administered 2011-10-23: 100 ug via INTRATHECAL

## 2011-10-23 MED ORDER — OXYTOCIN 40 UNITS IN LACTATED RINGERS INFUSION - SIMPLE MED: 62.5000 mL/h | INTRAVENOUS | Status: AC

## 2011-10-23 MED ORDER — SIMETHICONE 80 MG PO CHEW
80.0000 mg | CHEWABLE_TABLET | Freq: Three times a day (TID) | ORAL | Status: DC
  Administered 2011-10-24 – 2011-10-27 (×12): 80 mg via ORAL

## 2011-10-23 MED ORDER — PROMETHAZINE HCL 25 MG/ML IJ SOLN
6.2500 mg | INTRAMUSCULAR | Status: DC | PRN
Start: 2011-10-23 — End: 2011-10-23

## 2011-10-23 MED ORDER — FENTANYL CITRATE 0.05 MG/ML IJ SOLN
INTRAMUSCULAR | Status: DC | PRN
  Administered 2011-10-23: 50 ug via INTRAVENOUS
  Administered 2011-10-23: 25 ug via INTRATHECAL
  Administered 2011-10-23: 25 ug via INTRAVENOUS

## 2011-10-23 MED ORDER — LACTATED RINGERS IV SOLN
INTRAVENOUS | Status: DC | PRN
  Administered 2011-10-23 (×4): via INTRAVENOUS

## 2011-10-23 MED ORDER — LIDOCAINE-EPINEPHRINE (PF) 2 %-1:200000 IJ SOLN
INTRAMUSCULAR | Status: AC
  Filled 2011-10-23: qty 20

## 2011-10-23 MED ORDER — NALOXONE HCL 0.4 MG/ML IJ SOLN: 0.4000 mg | INTRAMUSCULAR | Status: DC | PRN

## 2011-10-23 MED ORDER — FENTANYL CITRATE 0.05 MG/ML IJ SOLN
25.0000 ug | INTRAMUSCULAR | Status: DC | PRN
  Administered 2011-10-23: 50 ug via INTRAVENOUS

## 2011-10-23 MED ORDER — NALBUPHINE HCL 10 MG/ML IJ SOLN
5.0000 mg | INTRAMUSCULAR | Status: DC | PRN
  Filled 2011-10-23: qty 1

## 2011-10-23 MED ORDER — DEXTROSE 5 % IV SOLN
2.0000 g | INTRAVENOUS | Status: AC
  Administered 2011-10-23: 2 g via INTRAVENOUS
  Filled 2011-10-23: qty 2

## 2011-10-23 MED ORDER — EPHEDRINE 5 MG/ML INJ
INTRAVENOUS | Status: AC
  Filled 2011-10-23: qty 10

## 2011-10-23 MED ORDER — SCOPOLAMINE 1 MG/3DAYS TD PT72
1.0000 | MEDICATED_PATCH | Freq: Once | TRANSDERMAL | Status: AC
  Administered 2011-10-23: 1.5 mg via TRANSDERMAL

## 2011-10-23 MED ORDER — MEPERIDINE HCL 25 MG/ML IJ SOLN: 6.2500 mg | INTRAMUSCULAR | Status: DC | PRN

## 2011-10-23 MED ORDER — DIPHENHYDRAMINE HCL 25 MG PO CAPS
25.0000 mg | ORAL_CAPSULE | ORAL | Status: DC | PRN
  Filled 2011-10-23: qty 1

## 2011-10-23 MED ORDER — BUPIVACAINE IN DEXTROSE 0.75-8.25 % IT SOLN
INTRATHECAL | Status: DC | PRN
  Administered 2011-10-23: 1.6 mL via INTRATHECAL

## 2011-10-23 MED ORDER — ONDANSETRON HCL 4 MG/2ML IJ SOLN: 4.0000 mg | Freq: Three times a day (TID) | INTRAMUSCULAR | Status: DC | PRN

## 2011-10-23 MED ORDER — KETOROLAC TROMETHAMINE 30 MG/ML IJ SOLN
30.0000 mg | Freq: Four times a day (QID) | INTRAMUSCULAR | Status: AC | PRN
  Administered 2011-10-23: 30 mg via INTRAVENOUS

## 2011-10-23 MED ORDER — ONDANSETRON HCL 4 MG/2ML IJ SOLN
INTRAMUSCULAR | Status: DC | PRN
  Administered 2011-10-23: 4 mg via INTRAVENOUS

## 2011-10-23 MED ORDER — ONDANSETRON HCL 4 MG PO TABS: 4.0000 mg | ORAL_TABLET | ORAL | Status: DC | PRN

## 2011-10-23 MED ORDER — METOCLOPRAMIDE HCL 5 MG/ML IJ SOLN: 10.0000 mg | Freq: Three times a day (TID) | INTRAMUSCULAR | Status: DC | PRN

## 2011-10-23 MED ORDER — MENTHOL 3 MG MT LOZG: 1.0000 | LOZENGE | OROMUCOSAL | Status: DC | PRN

## 2011-10-23 MED ORDER — TETANUS-DIPHTH-ACELL PERTUSSIS 5-2.5-18.5 LF-MCG/0.5 IM SUSP: 0.5000 mL | Freq: Once | INTRAMUSCULAR | Status: DC

## 2011-10-23 MED ORDER — BUPIVACAINE HCL (PF) 0.5 % IJ SOLN
INTRAMUSCULAR | Status: AC
  Filled 2011-10-23: qty 30

## 2011-10-23 MED ORDER — CITRIC ACID-SODIUM CITRATE 334-500 MG/5ML PO SOLN
ORAL | Status: AC
  Filled 2011-10-23: qty 15

## 2011-10-23 MED ORDER — SODIUM CHLORIDE 0.9 % IV SOLN
1.0000 ug/kg/h | INTRAVENOUS | Status: DC | PRN
  Filled 2011-10-23: qty 2.5

## 2011-10-23 MED ORDER — DIPHENHYDRAMINE HCL 50 MG/ML IJ SOLN: 25.0000 mg | INTRAMUSCULAR | Status: DC | PRN

## 2011-10-23 MED ORDER — IBUPROFEN 600 MG PO TABS
600.0000 mg | ORAL_TABLET | Freq: Four times a day (QID) | ORAL | Status: DC
  Administered 2011-10-24 – 2011-10-27 (×13): 600 mg via ORAL
  Filled 2011-10-23 (×12): qty 1

## 2011-10-23 MED ORDER — WITCH HAZEL-GLYCERIN EX PADS: 1.0000 "application " | MEDICATED_PAD | CUTANEOUS | Status: DC | PRN

## 2011-10-23 MED ORDER — SIMETHICONE 80 MG PO CHEW: 80.0000 mg | CHEWABLE_TABLET | ORAL | Status: DC | PRN

## 2011-10-23 MED ORDER — SENNOSIDES-DOCUSATE SODIUM 8.6-50 MG PO TABS
2.0000 | ORAL_TABLET | Freq: Every day | ORAL | Status: DC
  Administered 2011-10-24 – 2011-10-26 (×3): 2 via ORAL

## 2011-10-23 MED ORDER — OXYTOCIN 10 UNIT/ML IJ SOLN
40.0000 [IU] | INTRAVENOUS | Status: DC | PRN
  Administered 2011-10-23: 40 [IU] via INTRAVENOUS

## 2011-10-23 MED ORDER — SODIUM CHLORIDE 0.9 % IJ SOLN: 3.0000 mL | INTRAMUSCULAR | Status: DC | PRN

## 2011-10-23 MED ORDER — DIBUCAINE 1 % RE OINT: 1.0000 "application " | TOPICAL_OINTMENT | RECTAL | Status: DC | PRN

## 2011-10-23 MED ORDER — CARBOPROST TROMETHAMINE 250 MCG/ML IM SOLN
INTRAMUSCULAR | Status: AC
  Filled 2011-10-23: qty 1

## 2011-10-23 MED ORDER — ZOLPIDEM TARTRATE 5 MG PO TABS: 5.0000 mg | ORAL_TABLET | Freq: Every evening | ORAL | Status: DC | PRN

## 2011-10-23 MED ORDER — ONDANSETRON HCL 4 MG/2ML IJ SOLN: 4.0000 mg | INTRAMUSCULAR | Status: DC | PRN

## 2011-10-23 MED ORDER — LACTATED RINGERS IV SOLN
INTRAVENOUS | Status: DC
  Administered 2011-10-23: 16:00:00 via INTRAVENOUS

## 2011-10-23 MED ORDER — METHYLERGONOVINE MALEATE 0.2 MG/ML IJ SOLN
INTRAMUSCULAR | Status: AC
  Filled 2011-10-23: qty 1

## 2011-10-23 MED ORDER — PHENYLEPHRINE HCL 10 MG/ML IJ SOLN
INTRAMUSCULAR | Status: DC | PRN
  Administered 2011-10-23: 80 ug via INTRAVENOUS
  Administered 2011-10-23: 40 ug via INTRAVENOUS
  Administered 2011-10-23: 80 ug via INTRAVENOUS

## 2011-10-23 MED ORDER — FENTANYL CITRATE 0.05 MG/ML IJ SOLN
INTRAMUSCULAR | Status: AC
  Filled 2011-10-23: qty 2

## 2011-10-23 MED ORDER — SODIUM BICARBONATE 8.4 % IV SOLN
INTRAVENOUS | Status: AC
  Filled 2011-10-23: qty 50

## 2011-10-23 MED ORDER — DIPHENHYDRAMINE HCL 50 MG/ML IJ SOLN: 12.5000 mg | INTRAMUSCULAR | Status: DC | PRN

## 2011-10-23 MED ORDER — KETOROLAC TROMETHAMINE 30 MG/ML IJ SOLN
30.0000 mg | Freq: Four times a day (QID) | INTRAMUSCULAR | Status: AC | PRN
  Administered 2011-10-23: 30 mg via INTRAMUSCULAR
  Filled 2011-10-23: qty 1

## 2011-10-23 MED ORDER — HYDROMORPHONE HCL PF 1 MG/ML IJ SOLN
0.5000 mg | INTRAMUSCULAR | Status: AC
  Administered 2011-10-23: 0.5 mg via INTRAVENOUS
  Filled 2011-10-23: qty 1

## 2011-10-23 MED ORDER — EPHEDRINE SULFATE 50 MG/ML IJ SOLN
INTRAMUSCULAR | Status: DC | PRN
  Administered 2011-10-23 (×2): 10 mg via INTRAVENOUS
  Administered 2011-10-23: 5 mg via INTRAVENOUS

## 2011-10-23 MED ORDER — 0.9 % SODIUM CHLORIDE (POUR BTL) OPTIME
TOPICAL | Status: DC | PRN
  Administered 2011-10-23: 1000 mL

## 2011-10-23 MED ORDER — MORPHINE SULFATE 0.5 MG/ML IJ SOLN
INTRAMUSCULAR | Status: AC
  Filled 2011-10-23: qty 10

## 2011-10-23 MED ORDER — SODIUM BICARBONATE 8.4 % IV SOLN
INTRAVENOUS | Status: DC | PRN
  Administered 2011-10-23: 3 mL via EPIDURAL
  Administered 2011-10-23: 7 mL via EPIDURAL

## 2011-10-23 MED ORDER — DIPHENHYDRAMINE HCL 25 MG PO CAPS: 25.0000 mg | ORAL_CAPSULE | Freq: Four times a day (QID) | ORAL | Status: DC | PRN

## 2011-10-23 MED ORDER — LACTATED RINGERS IV SOLN
INTRAVENOUS | Status: DC | PRN
  Administered 2011-10-23: 12:00:00 via INTRAVENOUS

## 2011-10-23 MED ORDER — OXYTOCIN 10 UNIT/ML IJ SOLN
INTRAMUSCULAR | Status: AC
  Filled 2011-10-23: qty 4

## 2011-10-23 MED ORDER — PRENATAL MULTIVITAMIN CH
1.0000 | ORAL_TABLET | Freq: Every day | ORAL | Status: DC
  Administered 2011-10-24 – 2011-10-27 (×4): 1 via ORAL
  Filled 2011-10-23 (×4): qty 1

## 2011-10-23 SURGICAL SUPPLY — 30 items
CLIP FILSHIE TUBAL LIGA STRL (Clip) ×3 IMPLANT
CLOTH BEACON ORANGE TIMEOUT ST (SAFETY) ×3 IMPLANT
DRAPE SURG 17X23 STRL (DRAPES) ×3 IMPLANT
DRESSING TELFA 8X3 (GAUZE/BANDAGES/DRESSINGS) IMPLANT
DRSG COVADERM 4X10 (GAUZE/BANDAGES/DRESSINGS) ×3 IMPLANT
DURAPREP 26ML APPLICATOR (WOUND CARE) ×3 IMPLANT
ELECT REM PT RETURN 9FT ADLT (ELECTROSURGICAL) ×3
ELECTRODE REM PT RTRN 9FT ADLT (ELECTROSURGICAL) ×2 IMPLANT
EXTRACTOR VACUUM M CUP 4 TUBE (SUCTIONS) IMPLANT
GAUZE SPONGE 4X4 12PLY STRL LF (GAUZE/BANDAGES/DRESSINGS) IMPLANT
GLOVE BIO SURGEON STRL SZ8 (GLOVE) ×3 IMPLANT
GLOVE SURG ORTHO 8.0 STRL STRW (GLOVE) ×3 IMPLANT
GOWN PREVENTION PLUS LG XLONG (DISPOSABLE) ×6 IMPLANT
KIT ABG SYR 3ML LUER SLIP (SYRINGE) ×3 IMPLANT
NEEDLE HYPO 25X5/8 SAFETYGLIDE (NEEDLE) ×3 IMPLANT
NS IRRIG 1000ML POUR BTL (IV SOLUTION) ×3 IMPLANT
PACK C SECTION WH (CUSTOM PROCEDURE TRAY) ×3 IMPLANT
PAD ABD 7.5X8 STRL (GAUZE/BANDAGES/DRESSINGS) IMPLANT
PAD OB MATERNITY 4.3X12.25 (PERSONAL CARE ITEMS) IMPLANT
SLEEVE SCD COMPRESS KNEE MED (MISCELLANEOUS) IMPLANT
STAPLER VISISTAT 35W (STAPLE) IMPLANT
SUT MNCRL 0 VIOLET CTX 36 (SUTURE) ×6 IMPLANT
SUT MONOCRYL 0 CTX 36 (SUTURE) ×3
SUT PDS AB 1 CT  36 (SUTURE)
SUT PDS AB 1 CT 36 (SUTURE) IMPLANT
SUT VIC AB 1 CTX 36 (SUTURE)
SUT VIC AB 1 CTX36XBRD ANBCTRL (SUTURE) IMPLANT
TOWEL OR 17X24 6PK STRL BLUE (TOWEL DISPOSABLE) ×6 IMPLANT
TRAY FOLEY CATH 14FR (SET/KITS/TRAYS/PACK) ×3 IMPLANT
WATER STERILE IRR 1000ML POUR (IV SOLUTION) ×3 IMPLANT

## 2011-10-23 NOTE — Anesthesia Procedure Notes (Addendum)
Spinal  Patient location during procedure: OR Start time: 10/23/2011 10:44 AM Staffing Anesthesiologist: Brayton Caves R Performed by: anesthesiologist  Preanesthetic Checklist Completed: patient identified, site marked, surgical consent, pre-op evaluation, timeout performed, IV checked, risks and benefits discussed and monitors and equipment checked Spinal Block Patient position: sitting Prep: DuraPrep Patient monitoring: heart rate, cardiac monitor, continuous pulse ox and blood pressure Approach: midline Location: L3-4 Injection technique: single-shot Needle Needle type: Sprotte  Needle gauge: 24 G Needle length: 9 cm Assessment Sensory level: T4 Additional Notes Patient identified.  Risk benefits discussed including failed block, incomplete pain control, headache, nerve damage, paralysis, blood pressure changes, nausea, vomiting, reactions to medication both toxic or allergic, and postpartum back pain.  Patient expressed understanding and wished to proceed.  All questions were answered.  Sterile technique used throughout procedure.  CSF was clear.  No parasthesia or other complications.  Please see nursing notes for vital signs.   Epidural Patient location during procedure: OB Start time: 10/23/2011 11:23 AM  Staffing Anesthesiologist: Brayton Caves R Performed by: anesthesiologist   Preanesthetic Checklist Completed: patient identified, site marked, surgical consent, pre-op evaluation, timeout performed, IV checked, risks and benefits discussed and monitors and equipment checked  Epidural Patient position: sitting Prep: site prepped and draped and DuraPrep Patient monitoring: continuous pulse ox and blood pressure Approach: midline Injection technique: LOR air and LOR saline  Needle:  Needle type: Tuohy  Needle gauge: 17 G Needle length: 9 cm and 9 Needle insertion depth: 5 cm cm Catheter type: closed end flexible Catheter size: 19 Gauge Catheter at skin  depth: 10 cm Test dose: negative  Assessment Events: blood not aspirated, injection not painful, no injection resistance, negative IV test and no paresthesia  Additional Notes Patient identified.  Risk benefits discussed including failed block, incomplete pain control, headache, nerve damage, paralysis, blood pressure changes, nausea, vomiting, reactions to medication both toxic or allergic, and postpartum back pain.  Patient expressed understanding and wished to proceed.  All questions were answered.  Sterile technique used throughout procedure and epidural site dressed with sterile barrier dressing. No paresthesia or other complications noted.The patient did not experience any signs of intravascular injection such as tinnitus or metallic taste in mouth nor signs of intrathecal spread such as rapid motor block. Please see nursing notes for vital signs.

## 2011-10-23 NOTE — Addendum Note (Signed)
Addendum  created 10/23/11 1455 by Elbert Ewings, CRNA   Modules edited:Notes Section

## 2011-10-23 NOTE — Anesthesia Postprocedure Evaluation (Signed)
Anesthesia Post Note  Patient: [name]  Procedure(s) Performed: Procedure(s) (LRB): CESAREAN SECTION (N/A) CESAREAN SECTION WITH BILATERAL TUBAL LIGATION ()  Anesthesia type: Epidural  Patient location: PACU  Post pain: Pain level controlled  Post assessment: Post-op Vital signs reviewed  Last Vitals:  Filed Vitals:   10/23/11 1210  BP:   Pulse: 85  Temp:   Resp: 21    Post vital signs: Reviewed  Level of consciousness: awake  Complications: No apparent anesthesia complications

## 2011-10-23 NOTE — Anesthesia Postprocedure Evaluation (Deleted)
  Anesthesia Post-op Note  Patient: [name]  Procedure(s) Performed: Procedure(s) (LRB) with comments: CESAREAN SECTION (N/A) CESAREAN SECTION WITH BILATERAL TUBAL LIGATION ()  Patient Location: PACU and Mother/Baby  Anesthesia Type: Spinal  Level of Consciousness: awake, alert  and oriented  Airway and Oxygen Therapy: Patient Spontanous Breathing  Post-op Pain: mild  Post-op Assessment: Patient's Cardiovascular Status Stable, Respiratory Function Stable, No signs of Nausea or vomiting and Pain level controlled  Post-op Vital Signs: stable  Complications: No apparent anesthesia complications

## 2011-10-23 NOTE — Op Note (Signed)
Cesarean Section Procedure Note  Pre-operative Diagnosis: Twins at 33 4/7, Labor, Adv cervical dilation,Breech/Transverse presentation, desires sterility  Post-operative Diagnosis: same  Surgeon: Turner Daniels   Assistants: none  Anesthesia:Spinal/Epidural  Procedure:  Low Segment Transverse cesarean section, Bilateral Tubal Ligation with Filshie clips  Procedure Details  The patient was seen in the Holding Room. The risks, benefits, complications, treatment options, and expected outcomes were discussed with the patient.  The patient concurred with the proposed plan, giving informed consent.  The site of surgery properly noted/marked.. A Time Out was held and the above information confirmed.  After induction of anesthesia, the patient was draped and prepped in the usual sterile manner. A Pfannenstiel incision was made and carried down through the subcutaneous tissue to the fascia. Fascial incision was made and extended transversely. The fascia was separated from the underlying rectus tissue superiorly and inferiorly. The peritoneum was identified and entered. Peritoneal incision was extended longitudinally. The utero-vesical peritoneal reflection was incised transversely and the bladder flap was bluntly freed from the lower uterine segment. A low transverse uterine incision was made. Delivered from [place] breech presentation was a baby A female with Apgar scores of 9 at one minute and 9 at five minutes. After the umbilical cord was clamped and cut cord blood was obtained for evaluation.  Baby B AROM then delivered by breech extraction of double footling breech, Female with apgars 9,9.  The placentas were removed intact and appeared normal. The uterine outline, tubes and ovaries appeared normal. The uterine incision was closed with running locked sutures of 0 monocryl and imbricated with 0 monocryl. Hemostasis was observed. Lavage was carried out until clear. The fallopian tubes were identified by their  fimbriated ends and filshie clips were placed across the tubes at the midportion of each tube.  Good placement was noted bilaterally.  The peritoneum was then closed with 0 monocryl and rectus muscles plicated in the midline.  After hemostasis was assured, the fascia was then reapproximated with running sutures of 0 Vicryl. Irrigation was applied and after adequate hemostasis was assured, the skin was reapproximated with staples.  Instrument, sponge, and needle counts were correct prior the abdominal closure and at the conclusion of the case. The patient received 2 grams cefotetan preoperatively.  Findings: Viable female A, viable female B Estimated Blood Loss:  800cc         Specimens: Placenta was sent to Pathology         Complications:  None

## 2011-10-23 NOTE — OR Nursing (Signed)
1148 Babies out of OR prior to banding per Dr. Ruben Gottron.

## 2011-10-23 NOTE — Anesthesia Preprocedure Evaluation (Signed)
Anesthesia Evaluation  Patient identified by MRN, date of birth, ID band Patient awake    Reviewed: Allergy & Precautions, H&P , Patient's Chart, lab work & pertinent test results  Airway Mallampati: II TM Distance: >3 FB Neck ROM: full    Dental No notable dental hx.    Pulmonary neg pulmonary ROS,  breath sounds clear to auscultation  Pulmonary exam normal       Cardiovascular negative cardio ROS  Rhythm:regular Rate:Normal     Neuro/Psych negative neurological ROS  negative psych ROS   GI/Hepatic negative GI ROS, Neg liver ROS,   Endo/Other  negative endocrine ROS  Renal/GU negative Renal ROS     Musculoskeletal   Abdominal   Peds  Hematology negative hematology ROS (+)   Anesthesia Other Findings   Reproductive/Obstetrics (+) Pregnancy                           Anesthesia Physical Anesthesia Plan  ASA: II  Anesthesia Plan: Spinal   Post-op Pain Management:    Induction:   Airway Management Planned:   Additional Equipment:   Intra-op Plan:   Post-operative Plan:   Informed Consent: I have reviewed the patients History and Physical, chart, labs and discussed the procedure including the risks, benefits and alternatives for the proposed anesthesia with the patient or authorized representative who has indicated his/her understanding and acceptance.     Plan Discussed with:   Anesthesia Plan Comments:         Anesthesia Quick Evaluation  

## 2011-10-23 NOTE — Transfer of Care (Signed)
Immediate Anesthesia Transfer of Care Note  Patient: [name]  Procedure(s) Performed: Procedure(s) (LRB) with comments: CESAREAN SECTION (N/A) CESAREAN SECTION WITH BILATERAL TUBAL LIGATION ()  Patient Location: PACU  Anesthesia Type: Spinal and Epidural  Level of Consciousness: awake  Airway & Oxygen Therapy: Patient Spontanous Breathing  Post-op Assessment: Report given to PACU RN  Post vital signs: Reviewed and stable  Complications: No apparent anesthesia complications

## 2011-10-23 NOTE — Progress Notes (Signed)
Patient ID: [name], female   DOB: July 09, 1985, 26 y.o.   MRN: [id number] Pt reports increasing pain with ctxs, now at every 5-7 minutes  VSSAF  FHR 140 /140 Ctxs q 5-7 minutes  Cx  4/95/-3 BBOW    PTL of Breech/transverse twins S/P BMZ Currently on MgSo4 without response Discussed plan of care with patient Plan for delivery since no longer responding to Mg  And FDA says Mg should be d/c d  At this time. Risks and benefits of C/S were discussed.  All questions were answered and informed consent was obtained.  Plan to proceed with low segment transverse Cesarean Section.

## 2011-10-24 ENCOUNTER — Encounter (HOSPITAL_COMMUNITY): Payer: Self-pay | Admitting: Obstetrics and Gynecology

## 2011-10-24 LAB — CBC
MCH: 26.8 pg (ref 26.0–34.0)
MCHC: 31.9 g/dL (ref 30.0–36.0)
MCV: 84 fL (ref 78.0–100.0)
Platelets: 228 10*3/uL (ref 150–400)
RBC: 2.69 MIL/uL — ABNORMAL LOW (ref 3.87–5.11)

## 2011-10-24 MED ORDER — FERROUS FUMARATE 325 (106 FE) MG PO TABS
1.0000 | ORAL_TABLET | Freq: Two times a day (BID) | ORAL | Status: DC
  Administered 2011-10-24 – 2011-10-27 (×7): 106 mg via ORAL
  Filled 2011-10-24 (×7): qty 1

## 2011-10-24 NOTE — Progress Notes (Signed)
Subjective: Postpartum Day 1: Cesarean Delivery Patient reports tolerating PO, + flatus and no problems voiding.  Babies stable in the NICU  Objective: Vital signs in last 24 hours: Temp:  [97.2 F (36.2 C)-98.4 F (36.9 C)] 98.4 F (36.9 C) (10/08 0328) Pulse Rate:  [60-107] 79  (10/08 0328) Resp:  [16-26] 18  (10/08 0328) BP: (109-143)/(59-76) 116/67 mmHg (10/08 0328) SpO2:  [95 %-100 %] 95 % (10/08 0328)  Physical Exam:  General: alert and cooperative Lochia: appropriate Uterine Fundus: firm Incision: abd dressing CDI DVT Evaluation: No evidence of DVT seen on physical exam. Negative Homan's sign. No cords or calf tenderness.   Basename 10/24/11 0535 10/23/11 0910  HGB 7.3* 9.0*  HCT 22.6* 28.5*    Assessment/Plan: Status post Cesarean section. Doing well postoperatively.  Continue current care.  Athanasios Heldman G 10/24/2011, 8:40 AM

## 2011-10-25 LAB — CBC
HCT: 23.4 % — ABNORMAL LOW (ref 36.0–46.0)
MCHC: 31.6 g/dL (ref 30.0–36.0)
MCV: 84.2 fL (ref 78.0–100.0)
RDW: 14.4 % (ref 11.5–15.5)

## 2011-10-25 NOTE — Progress Notes (Signed)
Subjective: Postpartum Day 2: Cesarean Delivery Patient reports tolerating PO, + flatus and no problems voiding.  Babies stable in nicu  Objective: Vital signs in last 24 hours: Temp:  [98 F (36.7 C)-98.6 F (37 C)] 98 F (36.7 C) (10/09 6295) Pulse Rate:  [67-82] 75  (10/09 0608) Resp:  [18-20] 19  (10/09 0608) BP: (98-137)/(57-84) 109/65 mmHg (10/09 0608) SpO2:  [99 %] 99 % (10/08 1130)  Physical Exam:  General: alert and cooperative Lochia: appropriate Uterine Fundus: firm Incision: healing well, staples intact DVT Evaluation: No evidence of DVT seen on physical exam. No significant calf/ankle edema.   Basename 10/25/11 0555 10/24/11 0535  HGB 7.4* 7.3*  HCT 23.4* 22.6*    Assessment/Plan: Status post Cesarean section. Doing well postoperatively.  Continue current care.  Zan Triska G 10/25/2011, 9:00 AM

## 2011-10-25 NOTE — Clinical Social Work Maternal (Signed)
Clinical Social Work Department PSYCHOSOCIAL ASSESSMENT - MATERNAL/CHILD 10/24/2011  Patient:  [name]  Account Number:  [id number]  Admit Date:  10/19/2011  Childs Name:   [name] and [name] Agard   Clinical Social Worker:  Greta Yung, LCSW   Date/Time:  10/24/2011 02:40 PM  Date Referred:  10/24/2011   Referral source NICU    Referred reason NICU Behavioral Health Issues  Other referral source:    I:  FAMILY / HOME ENVIRONMENT Child'Rogers legal guardian:  PARENT  Guardian - Name Guardian - Age Guardian - Address Myeasha Ndiaye [address] [place] [address] Matthew Guilbault  [address] [place] [address]  Other household support members/support persons Name Relationship DOB Charlie BROTHER 08/2008  Other support:   Good support system   II  PSYCHOSOCIAL DATA Information Source:  Family Interview  Financial and Community Resources Employment:   part time job at [employer] in [place], MOB [employer], FOB  Financial resources:  Private Insurance If Medicaid - County:    School / Grade:   Maternity Care Coordinator / Child Services Coordination / Early Interventions:  Cultural issues impacting care:   None identified   III  STRENGTHS Strengths Supportive family/friends Adequate Resources Home prepared for Child (including basic supplies) Understanding of illness Other - See comment  Strength comment:  family has chosen Pediatrician, Dr. Lukings   IV  RISK FACTORS AND CURRENT PROBLEMS Current Problem:  None   Risk Factor & Current Problem Patient Issue Family Issue Risk Factor / Current Problem Comment  N N    V  SOCIAL WORK ASSESSMENT Sw intern and Janaia Kozel, LCSW visited MOB to complete assessment.  PGM was present, asked if we could talk with visitor in room, access granted. MOB seemed to be informed and accepting of NICU admission. MOB states she has great family support and will  have no problem getting back and forth once discharged. FOB walked in about mid way through interview. FOB states he feels his babies are being well taken care of in the NICU. He states he works near the hospital and will be able to visit regularly. SW intern asked about previous bipolar diagnosis. MOB states she dealt with biploar in her teens and has not since her son was born. She states she has not had any episodes recently and has not been on any medications for it since she was 26 yrs old. SW educated family about signs and symptoms of post partum depression. MOB does not seem to have any presenting problems at this time.  They have no questions or needs.  SW explained support services offered by NICU SW and gave contact information.  Assessment and documentation by Carrie Coleman, MSW Intern.  Signed off by Tesslyn Baumert, LCSW.     VI SOCIAL WORK PLAN Social Work Plan Psychosocial Support/Ongoing Assessment of Needs  Type of pt/family education:   educated on signs and symptoms of post partum depression  If child protective services report - county:   If child protective services report - date:   Information/referral to community resources comment:   Other social work plan:   Social worker will continue to visit and provide support as needed with family while in NICU       Clinical Social Work Department PSYCHOSOCIAL ASSESSMENT - MATERNAL/CHILD 10/24/2011  Patient:  Vickie, Rogers  Account Number:  [id number]  Admit Date:  10/19/2011  Marjo Bicker Name:   Delorise Shiner and Doran Stabler    Clinical Social Worker:  Lulu Riding, LCSW   Date/Time:  10/24/2011 02:40 PM  Date Referred:  10/24/2011   Referral source  NICU     Referred reason  NICU  Behavioral Health Issues   Other referral source:    I:  FAMILY / HOME ENVIRONMENT Child'Rogers legal guardian:  PARENT  Guardian - Name Guardian - Age Guardian - Address  [name] [address] [place] [address]  [name]  [address] [place] [address]   Other household support members/support persons Name Relationship DOB  [name] 08/2008   Other support:   Good support system    II  PSYCHOSOCIAL DATA Information Source:  Family Interview  Financial and [employer] Employment:   part time job at [employer] in [place], MOB  [employer], FOB   Financial resources:  Media planner If [employer] - [place]:    School / Grade:   Maternity Care Coordinator / Child Services Coordination / Early Interventions:  Cultural issues impacting care:   None identified    III  STRENGTHS Strengths  Supportive family/friends  Adequate Resources  Home prepared for Child (including basic supplies)  Understanding of illness  Other - See comment   Strength comment:  family has chosen Pediatrician, Dr. Cathlyn Parsons   IV  RISK FACTORS AND CURRENT PROBLEMS Current Problem:  None   Risk Factor & Current Problem Patient Issue Family Issue Risk Factor / Current Problem Comment   N N     V  SOCIAL WORK ASSESSMENT Sw intern and Lulu Riding, LCSW visited MOB to complete assessment.  PGM was present, asked if we could talk with visitor in room, access granted. MOB seemed to be informed and accepting of NICU admission. MOB states she has  great family support and will have no problem getting back and forth once discharged. FOB walked in about mid way through interview. FOB states he feels his babies are being well taken care of in the NICU. He states he works near the hospital and will be able to visit regularly. SW intern asked about previous bipolar diagnosis. MOB states she dealt with biploar in her teens and has not since her son was born. She states she has not had any episodes recently and has not been on any medications for it since she was 26 yrs old. SW educated family about signs and symptoms of post partum depression. MOB does not seem to have any presenting problems at this time.  They have no questions or needs.  SW explained support services offered by NICU SW and gave contact information.  Assessment and documentation by Leandro Reasoner, MSW Intern.  Signed off by Lulu Riding, LCSW.      VI SOCIAL WORK PLAN Social Work Plan  Psychosocial Support/Ongoing Assessment of Needs   Type of pt/family education:   educated on signs and symptoms of post partum depression   If child protective services report - county:   If child protective services report - date:   Information/referral to community resources comment:   Other social work plan:   Child psychotherapist will continue to visit and provide support as needed with family while in NICU

## 2011-10-26 ENCOUNTER — Other Ambulatory Visit: Payer: BC Managed Care – PPO

## 2011-10-26 MED ORDER — BUTALBITAL-APAP-CAFFEINE 50-325-40 MG PO TABS
1.0000 | ORAL_TABLET | ORAL | Status: DC | PRN
  Administered 2011-10-26 – 2011-10-27 (×4): 1 via ORAL
  Filled 2011-10-26 (×4): qty 1

## 2011-10-26 NOTE — Progress Notes (Signed)
Subjective: Postpartum Day 3: Cesarean Delivery Patient reports tolerating PO, + flatus and no problems voiding.  C/o severe Ha, starting in her neck with radiation to front made worse with sitting or standing. Seems to improve with supine positioning. Does have h/o migraine, this ha is not similar   Objective: Vital signs in last 24 hours: Temp:  [97.8 F (36.6 C)-98.1 F (36.7 C)] 98.1 F (36.7 C) (10/09 2200) Pulse Rate:  [80-90] 90  (10/10 0220) Resp:  [18] 18  (10/09 2200) BP: (112-144)/(71-76) 123/74 mmHg (10/10 0220)  Physical Exam:  General: alert, cooperative and mild distress Lochia: appropriate Uterine Fundus: firm Incision: healing well, staples intact DVT Evaluation: No evidence of DVT seen on physical exam. Negative Homan's sign. No significant calf/ankle edema. DTR's 1+  Basename 10/25/11 0555 10/24/11 0535  HGB 7.4* 7.3*  HCT 23.4* 22.6*    Assessment/Plan: Status post Cesarean section. Postoperative course complicated by ?spinal HA  Anesthesia to evaluate patient.  Berdine Rasmusson G 10/26/2011, 9:08 AM

## 2011-10-26 NOTE — Consult Note (Signed)
Velna Hatchet, MD Physician Signed Anesthesiology Consult Note 10/26/2011 11:46 AM   SUBJECTIVE:  [name] is a 26 y.o. female who complains of a new headache that began yesterday morning. She is now postop day 3 status post cesarean delivery of twins under neuraxial anesthesia. Of note, her initial subarachnoid block resulted in inadequate dermatomal coverage for surgery therefore an epidural was placed with resultant adequate coverage. Both procedures were atraumatic and uncomplicated. She describes her headaches as intermittent, worse with sitting or standing, accompanied by nuchal pain, and mildly limiting her activities of daily living. She notes that it improves with lying down and Percocet. Prior neurological history: negative for trauma or similar headaches, although she does suffer from regular severe migraines. She notes no fever, malaise, or other infectious symptoms.  Scheduled Meds:    .  ferrous fumarate  1 tablet  Oral  BID    .  ibuprofen  600 mg  Oral  Q6H    .  prenatal multivitamin  1 tablet  Oral  Daily    .  scopolamine  1 patch  Transdermal  Once    .  senna-docusate  2 tablet  Oral  QHS    .  simethicone  80 mg  Oral  TID PC & HS     Continuous Infusions:    .  lactated ringers  125 mL/hr at 10/23/11 1622    .  naloxone (NARCAN) infusion      PRN Meds:dibucaine, diphenhydrAMINE, diphenhydrAMINE, diphenhydrAMINE, diphenhydrAMINE, lanolin, menthol-cetylpyridinium, metoCLOPramide (REGLAN) injection, nalbuphine, nalbuphine, naloxone (NARCAN) infusion, naloxone, ondansetron (ZOFRAN) IV, ondansetron (ZOFRAN) IV, ondansetron, oxyCODONE-acetaminophen, simethicone, sodium chloride, witch hazel-glycerin, zolpidem  OBJECTIVE:  Appearance: she was able to sit up in bed and discuss her headaches. She was alert, well appearing, and in no distress.  Neurological Exam:alert, oriented, normal speech, no focal findings or movement disorder noted.  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  PLAN: I discussed multiple options with the patient and she would prefer to proceed with medical management as of now.  1) I recommended her to drink caffeinated products  2) Fioricet every 4 hours PRN for symptomatic relief  3) I discussed giving the patient an IV fluid bolus and IV cosyntropin, she would prefer to defer this treatment for now and try caffeine  4) I also discussed epidural blood patch is a potential treatment. Patient would prefer to avoid this therapy if possible. She knows that if she would like to proceed with the other treatment options ( cosyntropin, IV fluid, or epidural blood patch) that we are happy to provide these services. Discussed the plan of care with the patient and her questions were answered. I spent 20 minutes in the evaluation and management of this patient. Thank you for the consult.

## 2011-10-26 NOTE — Progress Notes (Signed)
Patient has had 2 headaches tonight, one around 2200 and another that started before 0200. Both described as pressure and tension. Patient states that this last time, it seems worse with sitting as she is trying to pump. Patient most recently at 0158, cold and heat therapies tried, as well as a Diet Coke without relief. BP stable with no signs or symptoms of PIH noted. House Coverage Ottis Stain RN consulted and she spoke to Dr. Jean Rosenthal with anesthesia at 0230 regarding whether this could be a spinal headache. Per Dr. Jean Rosenthal, CRNA will see patient to evaluate in the morning. Will continue to monitor patient.

## 2011-10-26 NOTE — Addendum Note (Signed)
Addendum  created 10/26/11 1858 by Sandrea Hughs., MD   Modules edited:Orders, PRL Based Order Sets

## 2011-10-26 NOTE — Consult Note (Deleted)
SUBJECTIVE: [name] is a 26 y.o. female who complains of a new headache that began yesterday morning. She is now postop day 3 status post cesarean delivery of twins under neuraxial anesthesia.  Of note, initial spinal waist met resulted in an adequate dermatomal coverage for surgery therefore an epidural was placed with resultant adequate coverage.  Both procedures were atraumatic and uncomplicated.  She describes her headaches as intermittent, worse with sitting or standing, accompanied by nuchal pain, and mildly limiting her activities of daily living.  She notes that it improves with lying down and Percocet.  Prior neurological history: negative for trauma or similar headaches, although she does suffer from regular severe migraines.  She notes no fever, malaise, or other infectious symptoms.   Scheduled Meds:   . ferrous fumarate  1 tablet Oral BID  . ibuprofen  600 mg Oral Q6H  . prenatal multivitamin  1 tablet Oral Daily  . scopolamine  1 patch Transdermal Once  . senna-docusate  2 tablet Oral QHS  . simethicone  80 mg Oral TID PC & HS   Continuous Infusions:   . lactated ringers 125 mL/hr at 10/23/11 1622  . naloxone (NARCAN) infusion     PRN Meds:dibucaine, diphenhydrAMINE, diphenhydrAMINE, diphenhydrAMINE, diphenhydrAMINE, lanolin, menthol-cetylpyridinium, metoCLOPramide (REGLAN) injection, nalbuphine, nalbuphine, naloxone (NARCAN) infusion, naloxone, ondansetron (ZOFRAN) IV, ondansetron (ZOFRAN) IV, ondansetron, oxyCODONE-acetaminophen, simethicone, sodium chloride, witch hazel-glycerin, zolpidem   OBJECTIVE: Appearance: she was able to sit up in bed and discuss her headaches.  She was alert, well appearing, and in no distress. Neurological Exam:alert, oriented, normal speech, no focal findings or movement disorder noted. 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  PLAN:  I discussed multiple options with the patient and she would prefer to proceed with medical management as of now.   1) I recommended her to drink caffeinated products 2) Fioricet every 4 hours PRN for symptomatic relief 3) I discussed giving the patient an IV fluid bolus and IV cosyntropin, she would prefer to defer this treatment for now and try caffeine 4) I also discussed epidural blood patch is a potential treatment.  Patient would prefer to avoid this therapy if possible.  She knows that if she would like to proceed with the other treatment options ( cosyntropin, IV fluid, or epidural blood patch) that we are happy to provide these services.  Discussed the plan of care with the patient and her questions were answered.  I spent 20 minutes in the evaluation and management of this patient.  Thank you for the consult.

## 2011-10-26 NOTE — Progress Notes (Signed)
Patient changed her mind about getting blood patch done, blood patch cancelled

## 2011-10-26 NOTE — Addendum Note (Signed)
Addendum  created 10/26/11 1222 by Velna Hatchet, MD   Modules edited:Orders

## 2011-10-26 NOTE — Addendum Note (Signed)
Addendum  created 10/26/11 1206 by Velna Hatchet, MD   Modules edited:Inpatient Notes, Orders

## 2011-10-27 LAB — TYPE AND SCREEN
ABO/RH(D): A POS
Antibody Screen: NEGATIVE
Unit division: 0

## 2011-10-27 MED ORDER — PRENATAL MULTIVITAMIN CH: 1.0000 | ORAL_TABLET | Freq: Every day | ORAL | Status: DC

## 2011-10-27 MED ORDER — OXYCODONE-ACETAMINOPHEN 5-325 MG PO TABS: 1.0000 | ORAL_TABLET | ORAL | Status: DC | PRN

## 2011-10-27 MED ORDER — BUTALBITAL-APAP-CAFFEINE 50-325-40 MG PO TABS: 1.0000 | ORAL_TABLET | ORAL | Status: DC | PRN

## 2011-10-27 MED ORDER — IBUPROFEN 600 MG PO TABS: 600.0000 mg | ORAL_TABLET | Freq: Four times a day (QID) | ORAL | Status: DC

## 2011-10-27 NOTE — Discharge Summary (Signed)
Obstetric Discharge Summary Reason for Admission: preterm labor with twin gestation breech presentation Prenatal Procedures: ultrasound Intrapartum Procedures: cesarean: low cervical, transverse Postpartum Procedures: none Complications-Operative and Postpartum: spinal headache Hemoglobin  Date Value Range Status  10/25/2011 7.4* 12.0 - 15.0 g/dL Final     HCT  Date Value Range Status  10/25/2011 23.4* 36.0 - 46.0 % Final    Physical Exam:  General: alert and cooperative Lochia: appropriate Uterine Fundus: firm Incision: healing well, staples removed DVT Evaluation: No evidence of DVT seen on physical exam. Negative Homan's sign. No cords or calf tenderness. No significant calf/ankle edema.  Discharge Diagnoses: s/p cesarean delivery of preterm twins  Discharge Information: Date: 10/27/2011 Activity: pelvic rest Diet: routine Medications: None, Ibuprofen, Percocet and Fioricet Condition: improved Instructions: refer to practice specific booklet Discharge to: home   Newborn Data:   Jude, Hailstone [[id number]]  Live born female  Birth Weight: 4 lb 3.7 oz (1920 g) APGAR: 9, 9   Adalina, Darcey [[id number]]  Live born female  Birth Weight:  APGAR: 9, 9  Home with babies in NICU.  CURTIS,CAROL G 10/27/2011, 9:25 AM

## 2011-10-28 ENCOUNTER — Encounter (HOSPITAL_COMMUNITY)
Admission: RE | Admit: 2011-10-28 | Discharge: 2011-10-28 | Disposition: A | Discharge status: Home / Self Care | Payer: BC Managed Care – PPO | Source: Ambulatory Visit | Attending: Obstetrics and Gynecology | Admitting: Obstetrics and Gynecology

## 2011-10-28 DIAGNOSIS — O923 Agalactia: Secondary | ICD-10-CM | POA: Insufficient documentation

## 2011-10-30 ENCOUNTER — Encounter (HOSPITAL_COMMUNITY): Payer: Self-pay | Admitting: *Deleted

## 2011-11-03 ENCOUNTER — Encounter (HOSPITAL_COMMUNITY): Payer: Self-pay | Admitting: *Deleted

## 2011-11-03 NOTE — OR Nursing (Signed)
11/03/2011 Late entry for omitted information by Milana Kidney, RN.

## 2011-11-23 ENCOUNTER — Encounter (HOSPITAL_COMMUNITY): Admission: RE | Payer: Self-pay | Source: Ambulatory Visit

## 2011-11-23 ENCOUNTER — Inpatient Hospital Stay (HOSPITAL_COMMUNITY): Admission: RE | Admit: 2011-11-23 | Payer: Self-pay | Source: Ambulatory Visit | Admitting: Obstetrics and Gynecology

## 2011-11-23 SURGERY — Surgical Case
Anesthesia: Regional

## 2011-11-28 ENCOUNTER — Encounter (HOSPITAL_COMMUNITY)
Admission: RE | Admit: 2011-11-28 | Discharge: 2011-11-28 | Disposition: A | Discharge status: Home / Self Care | Payer: BC Managed Care – PPO | Source: Ambulatory Visit | Attending: Obstetrics and Gynecology | Admitting: Obstetrics and Gynecology

## 2011-11-28 DIAGNOSIS — O923 Agalactia: Secondary | ICD-10-CM | POA: Insufficient documentation

## 2011-12-28 ENCOUNTER — Encounter (HOSPITAL_COMMUNITY)
Admission: RE | Admit: 2011-12-28 | Discharge: 2011-12-28 | Disposition: A | Discharge status: Home / Self Care | Payer: BC Managed Care – PPO | Source: Ambulatory Visit | Attending: Obstetrics and Gynecology | Admitting: Obstetrics and Gynecology

## 2011-12-28 DIAGNOSIS — O923 Agalactia: Secondary | ICD-10-CM | POA: Insufficient documentation

## 2012-01-28 ENCOUNTER — Encounter (HOSPITAL_COMMUNITY)
Admission: RE | Admit: 2012-01-28 | Discharge: 2012-01-28 | Disposition: A | Discharge status: Home / Self Care | Payer: BC Managed Care – PPO | Source: Ambulatory Visit | Attending: Obstetrics and Gynecology | Admitting: Obstetrics and Gynecology

## 2012-01-28 DIAGNOSIS — O923 Agalactia: Secondary | ICD-10-CM | POA: Insufficient documentation

## 2012-02-28 ENCOUNTER — Encounter (HOSPITAL_COMMUNITY)
Admission: RE | Admit: 2012-02-28 | Discharge: 2012-02-28 | Disposition: A | Discharge status: Home / Self Care | Payer: BC Managed Care – PPO | Source: Ambulatory Visit | Attending: Obstetrics and Gynecology | Admitting: Obstetrics and Gynecology

## 2012-02-28 DIAGNOSIS — O923 Agalactia: Secondary | ICD-10-CM | POA: Insufficient documentation

## 2012-03-02 ENCOUNTER — Other Ambulatory Visit: Payer: Self-pay

## 2012-03-28 ENCOUNTER — Encounter (HOSPITAL_COMMUNITY)
Admission: RE | Admit: 2012-03-28 | Discharge: 2012-03-28 | Disposition: A | Discharge status: Home / Self Care | Payer: BC Managed Care – PPO | Source: Ambulatory Visit | Attending: Obstetrics and Gynecology | Admitting: Obstetrics and Gynecology

## 2012-03-28 DIAGNOSIS — O923 Agalactia: Secondary | ICD-10-CM | POA: Insufficient documentation

## 2012-11-21 ENCOUNTER — Other Ambulatory Visit: Payer: Self-pay

## 2013-08-13 ENCOUNTER — Ambulatory Visit: Payer: Self-pay | Admitting: Obstetrics & Gynecology

## 2013-11-17 ENCOUNTER — Encounter (HOSPITAL_COMMUNITY): Payer: Self-pay | Admitting: *Deleted

## 2016-03-14 ENCOUNTER — Encounter: Payer: Self-pay | Admitting: Family Medicine

## 2016-03-14 ENCOUNTER — Ambulatory Visit (INDEPENDENT_AMBULATORY_CARE_PROVIDER_SITE_OTHER): Payer: Self-pay | Admitting: Family Medicine

## 2016-03-14 VITALS — BP 110/70 | Ht 66.0 in | Wt 115.5 lb

## 2016-03-14 DIAGNOSIS — R5383 Other fatigue: Secondary | ICD-10-CM

## 2016-03-14 DIAGNOSIS — R454 Irritability and anger: Secondary | ICD-10-CM

## 2016-03-14 DIAGNOSIS — F5101 Primary insomnia: Secondary | ICD-10-CM

## 2016-03-14 NOTE — Progress Notes (Signed)
   Subjective:    Patient ID: [name], female    DOB: 08/21/1985, 31 y.o.   MRN: [id number]  HPI Patient in today to discuss possible thyroid problem. Has c/o night sweats, trembling, fatigue, mood issues, weight problems.  Aggressive and irritable, mom is adopted not sure of thyroid issues  Past year ad a year and a half  Gets night sweats  Working around sixty hrs per wk, Working long, Does not have kids on the weekend  Will slow down later in the yr  Pt had hx of transient kidney shut down with red bull couple weeks ago  Tries to exercise three ties per wk   Trying to gain weight   Eating extra protiein   Several yrs ago weighred 125  125 or so then 127 highest  Gets a bit nervous and uncofortable  To eat alone  Non smoker no alcohol intake     States other concerns this visit.  Review of Systems No headache, no major weight loss or weight gain, no chest pain no back pain abdominal pain no change in bowel habits complete ROS otherwise negative     Objective:   Physical Exam  Alert vitals stable, NAD. Blood pressure good on repeat. HEENT normal. Lungs clear. Heart regular rate and rhythm.Claims no suicidal thoughts at this time Thyroid nonpalpable      Assessment & Plan:  Impression fatigue and moodiness and diminished appetite over the past year. Patient is very focused on her thyroid. Her GYN told her this is the likely etiology for her problems. She would like this tested. On further history he has history of remote depression in adolescence which even required a couple hospitalizations. Patient states that she does not feel particularly depressed at this time, but would be open to further exploration of this if her blood work returns normal.

## 2016-03-15 LAB — CBC WITH DIFFERENTIAL/PLATELET
Basophils Absolute: 0 10*3/uL (ref 0.0–0.2)
Basos: 1 %
EOS (ABSOLUTE): 0.1 10*3/uL (ref 0.0–0.4)
Eos: 1 %
Hematocrit: 41.6 % (ref 34.0–46.6)
Hemoglobin: 14 g/dL (ref 11.1–15.9)
Immature Grans (Abs): 0 10*3/uL (ref 0.0–0.1)
Immature Granulocytes: 0 %
Lymphocytes Absolute: 1.7 10*3/uL (ref 0.7–3.1)
Lymphs: 28 %
MCH: 28.6 pg (ref 26.6–33.0)
MCHC: 33.7 g/dL (ref 31.5–35.7)
MCV: 85 fL (ref 79–97)
Monocytes Absolute: 0.4 10*3/uL (ref 0.1–0.9)
Monocytes: 6 %
Neutrophils Absolute: 3.9 10*3/uL (ref 1.4–7.0)
Neutrophils: 64 %
Platelets: 327 10*3/uL (ref 150–379)
RBC: 4.89 x10E6/uL (ref 3.77–5.28)
RDW: 14.8 % (ref 12.3–15.4)
WBC: 6.2 10*3/uL (ref 3.4–10.8)

## 2016-03-15 LAB — HEPATIC FUNCTION PANEL
ALT: 14 IU/L (ref 0–32)
AST: 17 IU/L (ref 0–40)
Albumin: 4.8 g/dL (ref 3.5–5.5)
Alkaline Phosphatase: 40 IU/L (ref 39–117)
Bilirubin Total: 0.4 mg/dL (ref 0.0–1.2)
Bilirubin, Direct: 0.13 mg/dL (ref 0.00–0.40)
Total Protein: 6.9 g/dL (ref 6.0–8.5)

## 2016-03-15 LAB — BASIC METABOLIC PANEL
BUN/Creatinine Ratio: 21 (ref 9–23)
BUN: 14 mg/dL (ref 6–20)
CO2: 26 mmol/L (ref 18–29)
Calcium: 9.6 mg/dL (ref 8.7–10.2)
Chloride: 104 mmol/L (ref 96–106)
Creatinine, Ser: 0.67 mg/dL (ref 0.57–1.00)
GFR calc Af Amer: 136 mL/min/{1.73_m2} (ref >59)
GFR calc non Af Amer: 118 mL/min/{1.73_m2} (ref >59)
Glucose: 83 mg/dL (ref 65–99)
Potassium: 4.5 mmol/L (ref 3.5–5.2)
Sodium: 143 mmol/L (ref 134–144)

## 2016-03-15 LAB — TSH: TSH: 0.654 u[IU]/mL (ref 0.450–4.500)

## 2016-03-16 ENCOUNTER — Ambulatory Visit: Payer: Self-pay | Admitting: Family Medicine

## 2017-07-05 ENCOUNTER — Other Ambulatory Visit: Payer: Self-pay

## 2017-07-05 ENCOUNTER — Emergency Department (HOSPITAL_COMMUNITY)
Admission: EM | Admit: 2017-07-05 | Discharge: 2017-07-07 | Disposition: A | Discharge status: Home / Self Care | Payer: Self-pay | Attending: Emergency Medicine | Admitting: Emergency Medicine

## 2017-07-05 ENCOUNTER — Encounter (HOSPITAL_COMMUNITY): Payer: Self-pay | Admitting: Emergency Medicine

## 2017-07-05 ENCOUNTER — Emergency Department (HOSPITAL_COMMUNITY): Payer: Self-pay

## 2017-07-05 DIAGNOSIS — F411 Generalized anxiety disorder: Secondary | ICD-10-CM | POA: Insufficient documentation

## 2017-07-05 DIAGNOSIS — Z91040 Latex allergy status: Secondary | ICD-10-CM | POA: Insufficient documentation

## 2017-07-05 DIAGNOSIS — F321 Major depressive disorder, single episode, moderate: Secondary | ICD-10-CM | POA: Insufficient documentation

## 2017-07-05 DIAGNOSIS — F410 Panic disorder [episodic paroxysmal anxiety] without agoraphobia: Secondary | ICD-10-CM | POA: Diagnosis present

## 2017-07-05 DIAGNOSIS — Z79899 Other long term (current) drug therapy: Secondary | ICD-10-CM | POA: Insufficient documentation

## 2017-07-05 LAB — PREGNANCY, URINE: Preg Test, Ur: NEGATIVE

## 2017-07-05 LAB — URINALYSIS, ROUTINE W REFLEX MICROSCOPIC
Bilirubin Urine: NEGATIVE
GLUCOSE, UA: NEGATIVE mg/dL
Ketones, ur: 20 mg/dL — AB
Leukocytes, UA: NEGATIVE
Nitrite: NEGATIVE
Protein, ur: NEGATIVE mg/dL
SPECIFIC GRAVITY, URINE: 1.024 (ref 1.005–1.030)
pH: 8 (ref 5.0–8.0)

## 2017-07-05 LAB — RAPID URINE DRUG SCREEN, HOSP PERFORMED
AMPHETAMINES: NOT DETECTED
Benzodiazepines: POSITIVE — AB
Cocaine: NOT DETECTED
OPIATES: NOT DETECTED
TETRAHYDROCANNABINOL: POSITIVE — AB

## 2017-07-05 LAB — COMPREHENSIVE METABOLIC PANEL
ALT: 17 U/L (ref 14–54)
AST: 17 U/L (ref 15–41)
Albumin: 5.1 g/dL — ABNORMAL HIGH (ref 3.5–5.0)
Alkaline Phosphatase: 46 U/L (ref 38–126)
Anion gap: 10 (ref 5–15)
BUN: 17 mg/dL (ref 6–20)
CO2: 25 mmol/L (ref 22–32)
Calcium: 10 mg/dL (ref 8.9–10.3)
Chloride: 102 mmol/L (ref 101–111)
Creatinine, Ser: 0.65 mg/dL (ref 0.44–1.00)
GFR calc Af Amer: >60 mL/min (ref >60)
GFR calc non Af Amer: >60 mL/min (ref >60)
Glucose, Bld: 121 mg/dL — ABNORMAL HIGH (ref 65–99)
Potassium: 3.2 mmol/L — ABNORMAL LOW (ref 3.5–5.1)
Sodium: 137 mmol/L (ref 135–145)
Total Bilirubin: 1.8 mg/dL — ABNORMAL HIGH (ref 0.3–1.2)
Total Protein: 8.2 g/dL — ABNORMAL HIGH (ref 6.5–8.1)

## 2017-07-05 LAB — LIPASE, BLOOD: Lipase: 30 U/L (ref 11–51)

## 2017-07-05 LAB — CBC WITH DIFFERENTIAL/PLATELET
Basophils Absolute: 0 10*3/uL (ref 0.0–0.1)
Basophils Relative: 0 %
Eosinophils Absolute: 0 10*3/uL (ref 0.0–0.7)
Eosinophils Relative: 0 %
HCT: 45.8 % (ref 36.0–46.0)
Hemoglobin: 16 g/dL — ABNORMAL HIGH (ref 12.0–15.0)
Lymphocytes Relative: 13 %
Lymphs Abs: 1 10*3/uL (ref 0.7–4.0)
MCH: 28.7 pg (ref 26.0–34.0)
MCHC: 34.9 g/dL (ref 30.0–36.0)
MCV: 82.1 fL (ref 78.0–100.0)
Monocytes Absolute: 0.5 10*3/uL (ref 0.1–1.0)
Monocytes Relative: 7 %
Neutro Abs: 6.1 10*3/uL (ref 1.7–7.7)
Neutrophils Relative %: 80 %
Platelets: 292 10*3/uL (ref 150–400)
RBC: 5.58 MIL/uL — ABNORMAL HIGH (ref 3.87–5.11)
RDW: 12.8 % (ref 11.5–15.5)
WBC: 7.7 10*3/uL (ref 4.0–10.5)

## 2017-07-05 LAB — ETHANOL: Alcohol, Ethyl (B): <10 mg/dL (ref <10)

## 2017-07-05 LAB — TSH: TSH: 0.489 u[IU]/mL (ref 0.350–4.500)

## 2017-07-05 MED ORDER — ALPRAZOLAM 0.25 MG PO TABS
0.2500 mg | ORAL_TABLET | Freq: Once | ORAL | Status: AC
  Administered 2017-07-05: 0.25 mg via ORAL
  Filled 2017-07-05: qty 1

## 2017-07-05 MED ORDER — PANTOPRAZOLE SODIUM 20 MG PO TBEC
20.0000 mg | DELAYED_RELEASE_TABLET | Freq: Once | ORAL | Status: AC
  Administered 2017-07-05: 20 mg via ORAL
  Filled 2017-07-05: qty 1

## 2017-07-05 MED ORDER — HYDROXYZINE HCL 25 MG PO TABS
25.0000 mg | ORAL_TABLET | Freq: Four times a day (QID) | ORAL | Status: DC | PRN
  Administered 2017-07-05 – 2017-07-06 (×2): 25 mg via ORAL
  Filled 2017-07-05 (×2): qty 1

## 2017-07-05 MED ORDER — ONDANSETRON 4 MG PO TBDP
4.0000 mg | ORAL_TABLET | Freq: Once | ORAL | Status: AC
Start: 2017-07-05 — End: 2017-07-05
  Administered 2017-07-05: 4 mg via ORAL
  Filled 2017-07-05: qty 1

## 2017-07-05 MED ORDER — HYDROXYZINE HCL 25 MG PO TABS
25.0000 mg | ORAL_TABLET | Freq: Once | ORAL | Status: AC
  Administered 2017-07-05: 25 mg via ORAL
  Filled 2017-07-05: qty 1

## 2017-07-05 NOTE — ED Notes (Signed)
Bed: WBH34 Expected date:  Expected time:  Means of arrival:  Comments: Room 26 

## 2017-07-05 NOTE — ED Provider Notes (Signed)
[facility]-EMERGENCY DEPT Provider Note   CSN: [id number] Arrival date & time: 07/05/17  1056     History   Chief Complaint Chief Complaint  Patient presents with  . Panic Attack    HPI [name] is a 32 y.o. female.  HPI Patient reports that she has been dealing with severe anxiety for a long time.  She has been trying to manage this by predominantly keeping it a secret and using medications prescribed by her OB/GYN.  She has been taking Paxil for about 4 months.  She reports that she also had a prescription of 30 Xanax for that period of time to take if needed.  She reports she is however getting episodes of feeling intense anxiety with pressure throughout her chest and body.  She reports that starts to feel as if her whole body is just going to get compressed and employed.  She states that now if she tries to eat or drink something she feels a huge pressure and just has to vomit.  She reports that she has lost weight, after seeing herself in the mirror recently she started to really get scared.  She denies specific history of bulimia or anorexia.  She does report however she has had 2 episodes as a teenager where she had to be hospitalized for suicide attempt.  She denies she is suicidal at this time.  She reports that she knows her children need her and although she does not feel like she is much used to anyone, she does not feel like killing herself.  She states it seems like it is finally time that she needs to get help. Past Medical History:  Diagnosis Date  . Preterm labor     Patient Active Problem List   Diagnosis Date Noted  . Twin pregnancy, antepartum 10/12/2011  . Short cervix affecting pregnancy 10/12/2011    Past Surgical History:  Procedure Laterality Date  . CESAREAN SECTION  10/23/2011   Procedure: CESAREAN SECTION;  Surgeon: Turner Daniels, MD;  Location: [facility];  Service: Obstetrics;  Laterality: N/A;  . DILATION AND CURETTAGE OF  UTERUS  2006     OB History    Gravida  3   Para  2   Term  1   Preterm  1   AB  1   Living  3     SAB  1   TAB  0   Ectopic  0   Multiple  1   Live Births  3            Home Medications    Prior to Admission medications   Medication Sig Start Date End Date Taking? Authorizing Provider  ALPRAZolam (XANAX) 0.25 MG tablet Take 0.25 mg by mouth 3 (three) times daily as needed for anxiety. 04/25/17  Yes [provider]  PARoxetine (PAXIL-CR) 12.5 MG 24 hr tablet Take 12.5 mg by mouth daily. 06/19/17  Yes [provider]  butalbital-acetaminophen-caffeine (FIORICET, ESGIC) 50-325-40 MG per tablet Take 1 tablet by mouth every 4 (four) hours as needed for headache. Patient not taking: Reported on 03/14/2016 10/27/11   Julio Sicks, NP  ibuprofen (ADVIL,MOTRIN) 600 MG tablet Take 1 tablet (600 mg total) by mouth every 6 (six) hours. Patient not taking: Reported on 03/14/2016 10/27/11   Julio Sicks, NP  oxyCODONE-acetaminophen (PERCOCET/ROXICET) 5-325 MG per tablet Take 1-2 tablets by mouth every 4 (four) hours as needed (moderate - severe pain). Patient not taking: Reported on  03/14/2016 10/27/11   Julio Sicks, NP  Prenatal Vit-Fe Fumarate-FA (PRENATAL MULTIVITAMIN) TABS Take 1 tablet by mouth daily. Patient not taking: Reported on 03/14/2016 10/27/11   Julio Sicks, NP    Family History Family History  Problem Relation Age of Onset  . Alcohol abuse Father   . Cancer Father   . Early death Father     Social History Social History   Tobacco Use  . Smoking status: Never Smoker  . Smokeless tobacco: Never Used  Substance Use Topics  . Alcohol use: No  . Drug use: No     Allergies   Latex   Review of Systems Review of Systems 10 Systems reviewed and are negative for acute change except as noted in the HPI.   Physical Exam Updated Vital Signs BP 135/86 (BP Location: Left Arm)   Pulse 67   Temp 98 F (36.7 C) (Oral)   Resp 18    LMP 06/21/2017   SpO2 100%   Physical Exam  Constitutional: She is oriented to person, place, and time. She appears well-developed and well-nourished.  Patient is thin but clinically well in appearance.  She is alert and nontoxic.  Well-groomed.  Intermittently tearful.  HENT:  Head: Normocephalic and atraumatic.  Mouth/Throat: Oropharynx is clear and moist.  Eyes: Pupils are equal, round, and reactive to light. EOM are normal.  Neck: Neck supple.  Cardiovascular: Normal rate, regular rhythm, normal heart sounds and intact distal pulses.  Pulmonary/Chest: Effort normal and breath sounds normal.  Abdominal: Soft. Bowel sounds are normal. She exhibits no distension. There is no tenderness.  Musculoskeletal: Normal range of motion. She exhibits no edema or tenderness.  Neurological: She is alert and oriented to person, place, and time. She has normal strength. She exhibits normal muscle tone. Coordination normal. GCS eye subscore is 4. GCS verbal subscore is 5. GCS motor subscore is 6.  Skin: Skin is warm, dry and intact.  Psychiatric: She has a normal mood and affect.     ED Treatments / Results  Labs (all labs ordered are listed, but only abnormal results are displayed) Labs Reviewed  COMPREHENSIVE METABOLIC PANEL - Abnormal; Notable for the following components:      Result Value   Potassium 3.2 (*)    Glucose, Bld 121 (*)    Total Protein 8.2 (*)    Albumin 5.1 (*)    Total Bilirubin 1.8 (*)    All other components within normal limits  RAPID URINE DRUG SCREEN, HOSP PERFORMED - Abnormal; Notable for the following components:   Benzodiazepines POSITIVE (*)    Tetrahydrocannabinol POSITIVE (*)    Barbiturates   (*)    Value: Result not available. Reagent lot number recalled by manufacturer.   All other components within normal limits  CBC WITH DIFFERENTIAL/PLATELET - Abnormal; Notable for the following components:   RBC 5.58 (*)    Hemoglobin 16.0 (*)    All other components  within normal limits  URINALYSIS, ROUTINE W REFLEX MICROSCOPIC - Abnormal; Notable for the following components:   APPearance TURBID (*)    Hgb urine dipstick SMALL (*)    Ketones, ur 20 (*)    Bacteria, UA FEW (*)    All other components within normal limits  ETHANOL  TSH  LIPASE, BLOOD  PREGNANCY, URINE  I-STAT BETA HCG BLOOD, ED ([facility], [facility], [facility] ONLY)    EKG EKG Interpretation  Date/Time:  Thursday July 05 2017 15:03:44 EDT Ventricular Rate:  82 PR Interval:  140  QRS Duration: 86 QT Interval:  380 QTC Calculation: 443 R Axis:   84 Text Interpretation:  Normal sinus rhythm Normal ECG no change from old Confirmed by Arby BarrettePfeiffer, Whitney Hillegass [phone]) on 07/05/2017 4:02:47 PM   Radiology No results found.  Procedures Procedures (including critical care time)  Medications Ordered in ED Medications  pantoprazole (PROTONIX) EC tablet 20 mg (has no administration in time range)  ALPRAZolam (XANAX) tablet 0.25 mg (0.25 mg Oral Given 07/05/17 1454)     Initial Impression / Assessment and Plan / ED Course  I have reviewed the triage vital signs and the nursing notes.  Pertinent labs & imaging results that were available during my care of the patient were reviewed by me and considered in my medical decision making (see chart for details).      Final Clinical Impressions(s) / ED Diagnoses   Final diagnoses:  Panic attack  Current moderate episode of major depressive disorder, unspecified whether recurrent (HCC)  Patient is medically cleared for psychiatric evaluation.  She describes years of problems with anxiety but trying to deal with it.  She has 3 children that she cares for.  Denies suicidal ideation.  She however describes severe anxiety that is becoming socially debilitating and resulting in panic attacks.  She has been taking Paxil as prescribed by her GYN doctor for the past 3 months.  She also has been taking as needed Xanax for 3 months.  She reports she had 30 tablets for that  period of time.  Patient should not have issues with withdrawal syndrome.  Currently no signs of withdrawal.  Her mental status is clear.  She does not have vital sign instability or hyperadrenergic clinical exam.  Medically cleared for psychiatric management.  ED Discharge Orders    None       Arby BarrettePfeiffer, Katesha Eichel, MD 07/05/17 [phone]

## 2017-07-05 NOTE — ED Notes (Addendum)
Pt is alert and oriented x 4 and is verbally responsive. Pt reports that she has had anxiet and has not been able to eat or drink in days and states that when she drinks she feels pressure in chest and makes herself vomit. Pt denies and SI/HI, and reports that she is here because she has small children and needs help so that she feels better and is able to care for her children.   pt reports that she is out of Xanax and has a Rx at the Pharmacy prescribed by Dr. Rana SnareLowe. Pt reports that she hasn't taken any xanax in 2 weeks and then states that she took 1 tab this am around 630am. Pt is resting calm in room
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
## 2017-07-05 NOTE — ED Notes (Signed)
tts into see 

## 2017-07-05 NOTE — ED Notes (Signed)
Pt denies si/hi/avh at th is time, alert/oriented.  Pt reports that she has been having problems with anxiety for a long time, but recently (past week) it has gotten worse.  Pt also reports that she has been vomiting when she eats/ drinks and that this has gotten worse also.  Pt has lost a unknown amt of weight.  Pt reports that when she eats/drinks she has a lot of chest/stomach pressure that is only relieved with vomiting.  Pt reports that if she does  Not vomit she will make herself vomit.  Pt reports that she has been taking paxil for her depresson and it is helping.  Pt reports that she vomited prior to transfer to the unit after trying to eat.  PO fluids encouraged, pt encouraged not to gulp drinks.  Pt is unsure of how much wt she has lost, but appears emaciated.  Pt reports that when she saw herself in the mirror this morning she knew she needed help. Oriented to unit.

## 2017-07-05 NOTE — ED Notes (Signed)
Pt A&O x 3, resting at present.  Denies SI, HI or AVH.  Tolerating sips of fluids, no vomiting noted at present.  Monitoring for safety, Q 15 min checks in effect.  Pending comfort measures, requesting to take a shower.

## 2017-07-05 NOTE — ED Notes (Signed)
Reports nausea has improved some, no vomiting since arriving to unit.  PO fluids encouraged. Sipping on gateraide, gingerale.

## 2017-07-05 NOTE — ED Triage Notes (Signed)
Patient here from home with with complaints of panic attacks x1 week. Takes Xanax and Paxil. States that she took 2 Xanax this morning with no relief.

## 2017-07-06 DIAGNOSIS — F321 Major depressive disorder, single episode, moderate: Secondary | ICD-10-CM | POA: Insufficient documentation

## 2017-07-06 DIAGNOSIS — F410 Panic disorder [episodic paroxysmal anxiety] without agoraphobia: Secondary | ICD-10-CM

## 2017-07-06 MED ORDER — ENSURE ENLIVE PO LIQD: 237.0000 mL | Freq: Three times a day (TID) | ORAL | Status: DC

## 2017-07-06 MED ORDER — HYDROXYZINE HCL 25 MG PO TABS
25.0000 mg | ORAL_TABLET | Freq: Two times a day (BID) | ORAL | Status: DC
  Administered 2017-07-06 – 2017-07-07 (×2): 25 mg via ORAL
  Filled 2017-07-06 (×2): qty 1

## 2017-07-06 MED ORDER — PANTOPRAZOLE SODIUM 40 MG PO TBEC
40.0000 mg | DELAYED_RELEASE_TABLET | Freq: Once | ORAL | Status: AC
  Administered 2017-07-06: 40 mg via ORAL
  Filled 2017-07-06: qty 1

## 2017-07-06 MED ORDER — ENSURE ENLIVE PO LIQD
237.0000 mL | Freq: Three times a day (TID) | ORAL | Status: DC
  Administered 2017-07-06 (×3): 237 mL via ORAL
  Filled 2017-07-06 (×5): qty 237

## 2017-07-06 MED ORDER — MIRTAZAPINE 7.5 MG PO TABS
7.5000 mg | ORAL_TABLET | Freq: Every day | ORAL | Status: DC
  Administered 2017-07-06: 7.5 mg via ORAL
  Filled 2017-07-06: qty 1

## 2017-07-06 MED ORDER — ONDANSETRON 4 MG PO TBDP
4.0000 mg | ORAL_TABLET | Freq: Three times a day (TID) | ORAL | Status: DC | PRN
  Administered 2017-07-06 – 2017-07-07 (×3): 4 mg via ORAL
  Filled 2017-07-06 (×3): qty 1

## 2017-07-06 NOTE — ED Notes (Signed)
Complaint of anxiety and nausea.  Meds given.

## 2017-07-06 NOTE — Consult Note (Addendum)
[facility] Consult   Reason for Consult:  Anxious Referring Physician:  EDP Patient Identification: [name] MRN:  [id number] Principal Diagnosis: Panic disorder Diagnosis:   Patient Active Problem List   Diagnosis Date Noted  . Panic disorder [F41.0] 07/06/2017  . Current moderate episode of major depressive disorder ([place]) [F32.1]   . Panic attack [F41.0]   . Twin pregnancy, antepartum [O30.009] 10/12/2011  . Short cervix affecting pregnancy [O26.879] 10/12/2011    Total Time spent with patient: 45 minutes  Subjective:   [name] is a 32 y.o. female patient admitted with severe anxiety.  HPI:   Pt was seen and chart reviewed with treatment team and Dr Darleene Cleaver. Pt denies suicidal/homicidal ideation, denies auditory/visual hallucinations and does not appear to be responding to internal stimuli. Pt stated she has had anxiety and panic attacks off and on for quite a while. Her OB/GYN put her on Paxil and Xanax four months ago. She stated that on Saturday she started having a panic attack and has feels like it won't go away. Pt stated that she has tightness in her chest and then nausea and she forces herself to vomit. Pt stated she has lost weight in the past month and just wants some help. Pt's UDS positive for benzos and THC, BAL negative. Pt's medications will be adjusted and she will be observed for 24 hours for medication effectiveness and safety.   Past Psychiatric History: As above  Risk to Self: None Risk to Others: None Prior Inpatient Therapy: Prior Inpatient Therapy: No Prior Outpatient Therapy: Prior Outpatient Therapy: No Does patient have an ACCT team?: No Does patient have Intensive In-House Services?  : No Does patient have [facility] services? : No Does patient have P4CC services?: No  Past Medical History:  Past Medical History:  Diagnosis Date  . Preterm labor     Past Surgical History:  Procedure Laterality Date  . CESAREAN  SECTION  10/23/2011   Procedure: CESAREAN SECTION;  Surgeon: Luz Lex, MD;  Location: [facility];  Service: Obstetrics;  Laterality: N/A;  . DILATION AND CURETTAGE OF UTERUS  2006   Family History:  Family History  Problem Relation Age of Onset  . Alcohol abuse Father   . Cancer Father   . Early death Father    Family Psychiatric  History: Unknown Social History:  Social History   Substance and Sexual Activity  Alcohol Use No     Social History   Substance and Sexual Activity  Drug Use No    Social History   Socioeconomic History  . Marital status: Single    Spouse name: Not on file  . Number of children: Not on file  . Years of education: Not on file  . Highest education level: Not on file  Occupational History  . Not on file  Social Needs  . Financial resource strain: Not on file  . Food insecurity:    Worry: Not on file    Inability: Not on file  . Transportation needs:    Medical: Not on file    Non-medical: Not on file  Tobacco Use  . Smoking status: Never Smoker  . Smokeless tobacco: Never Used  Substance and Sexual Activity  . Alcohol use: No  . Drug use: No  . Sexual activity: Not Currently    Birth control/protection: Abstinence  Lifestyle  . Physical activity:    Days per week: Not on file    Minutes per session: Not on  file  . Stress: Not on file  Relationships  . Social connections:    Talks on phone: Not on file    Gets together: Not on file    Attends religious service: Not on file    Active member of club or organization: Not on file    Attends meetings of clubs or organizations: Not on file    Relationship status: Not on file  Other Topics Concern  . Not on file  Social History Narrative  . Not on file   Additional Social History:    Allergies:   Allergies  Allergen Reactions  . Latex Itching    Labs:  Results for orders placed or performed during the hospital encounter of 07/05/17 (from the past 48 hour(s))  Comprehensive  metabolic panel     Status: Abnormal   Collection Time: 07/05/17  2:37 PM  Result Value Ref Range   Sodium 137 135 - 145 mmol/L   Potassium 3.2 (L) 3.5 - 5.1 mmol/L   Chloride 102 101 - 111 mmol/L   CO2 25 22 - 32 mmol/L   Glucose, Bld 121 (H) 65 - 99 mg/dL   BUN 17 6 - 20 mg/dL   Creatinine, Ser 0.65 0.44 - 1.00 mg/dL   Calcium 10.0 8.9 - 10.3 mg/dL   Total Protein 8.2 (H) 6.5 - 8.1 g/dL   Albumin 5.1 (H) 3.5 - 5.0 g/dL   AST 17 15 - 41 U/L   ALT 17 14 - 54 U/L   Alkaline Phosphatase 46 38 - 126 U/L   Total Bilirubin 1.8 (H) 0.3 - 1.2 mg/dL   GFR calc non Af Amer >60 >60 mL/min   GFR calc Af Amer >60 >60 mL/min    Comment: (NOTE) The eGFR has been calculated using the CKD EPI equation. This calculation has not been validated in all clinical situations. eGFR's persistently <60 mL/min signify possible Chronic Kidney Disease.    Anion gap 10 5 - 15    Comment: Performed at [facility], [place] [address], [place], [place] [address]  Ethanol     Status: None   Collection Time: 07/05/17  2:37 PM  Result Value Ref Range   Alcohol, Ethyl (B) <10 <10 mg/dL    Comment: (NOTE) Lowest detectable limit for serum alcohol is 10 mg/dL. For medical purposes only. Performed at [facility], [place] [address] [place], [place] [address]   Urine rapid drug screen (hosp performed)     Status: Abnormal   Collection Time: 07/05/17  2:37 PM  Result Value Ref Range   Opiates NONE DETECTED NONE DETECTED   Cocaine NONE DETECTED NONE DETECTED   Benzodiazepines POSITIVE (A) NONE DETECTED   Amphetamines NONE DETECTED NONE DETECTED   Tetrahydrocannabinol POSITIVE (A) NONE DETECTED   Barbiturates (A) NONE DETECTED    Result not available. Reagent lot number recalled by manufacturer.    Comment: Performed at [facility], [place] [address] [place], [place] [address]  CBC with Diff     Status: Abnormal   Collection Time: 07/05/17  2:37 PM  Result  Value Ref Range   WBC 7.7 4.0 - 10.5 K/uL   RBC 5.58 (H) 3.87 - 5.11 MIL/uL   Hemoglobin 16.0 (H) 12.0 - 15.0 g/dL   HCT 45.8 36.0 - 46.0 %   MCV 82.1 78.0 - 100.0 fL   MCH 28.7 26.0 - 34.0 pg   MCHC 34.9 30.0 - 36.0 g/dL   RDW 12.8 11.5 -  15.5 %   Platelets 292 150 - 400 K/uL   Neutrophils Relative % 80 %   Neutro Abs 6.1 1.7 - 7.7 K/uL   Lymphocytes Relative 13 %   Lymphs Abs 1.0 0.7 - 4.0 K/uL   Monocytes Relative 7 %   Monocytes Absolute 0.5 0.1 - 1.0 K/uL   Eosinophils Relative 0 %   Eosinophils Absolute 0.0 0.0 - 0.7 K/uL   Basophils Relative 0 %   Basophils Absolute 0.0 0.0 - 0.1 K/uL    Comment: Performed at [facility], [place] [address] [place], [place] [address]  Urinalysis, Routine w reflex microscopic     Status: Abnormal   Collection Time: 07/05/17  2:37 PM  Result Value Ref Range   Color, Urine YELLOW YELLOW   APPearance TURBID (A) CLEAR   Specific Gravity, Urine 1.024 1.005 - 1.030   pH 8.0 5.0 - 8.0   Glucose, UA NEGATIVE NEGATIVE mg/dL   Hgb urine dipstick SMALL (A) NEGATIVE   Bilirubin Urine NEGATIVE NEGATIVE   Ketones, ur 20 (A) NEGATIVE mg/dL   Protein, ur NEGATIVE NEGATIVE mg/dL   Nitrite NEGATIVE NEGATIVE   Leukocytes, UA NEGATIVE NEGATIVE   RBC / HPF 11-20 0 - 5 RBC/hpf   Bacteria, UA FEW (A) NONE SEEN   Squamous Epithelial / LPF 6-10 0 - 5   Mucus PRESENT    Amorphous Crystal PRESENT     Comment: Performed at [facility], [place] [address] [place], [place] [address]  TSH     Status: None   Collection Time: 07/05/17  2:37 PM  Result Value Ref Range   TSH 0.489 0.350 - 4.500 uIU/mL    Comment: Performed by a 3rd Generation assay with a functional sensitivity of <=0.01 uIU/mL. Performed at [facility], [place] [address], [place], [place] [address]   Lipase, blood     Status: None   Collection Time: 07/05/17  2:37 PM  Result Value Ref Range   Lipase 30 11 - 51 U/L    Comment: Performed at  [facility], [place] [address], [place], [place] [address]  Pregnancy, urine     Status: None   Collection Time: 07/05/17  2:37 PM  Result Value Ref Range   Preg Test, Ur NEGATIVE NEGATIVE    Comment:        THE SENSITIVITY OF THIS METHODOLOGY IS >20 mIU/mL. Performed at [facility], [place] [address] [place], [place] [address]     Current Facility-Administered Medications  Medication Dose Route Frequency Provider Last Rate Last Dose  . hydrOXYzine (ATARAX/VISTARIL) tablet 25 mg  25 mg Oral BID Leoda Smithhart, MD      . mirtazapine (REMERON) tablet 7.5 mg  7.5 mg Oral QHS Crawford Tamura, MD      . ondansetron (ZOFRAN-ODT) disintegrating tablet 4 mg  4 mg Oral Q8H PRN Deno Etienne, DO   4 mg at 07/06/17 6962   Current Outpatient Medications  Medication Sig Dispense Refill  . ALPRAZolam (XANAX) 0.25 MG tablet Take 0.25 mg by mouth 3 (three) times daily as needed for anxiety.  0  . PARoxetine (PAXIL-CR) 12.5 MG 24 hr tablet Take 12.5 mg by mouth daily.  12  . butalbital-acetaminophen-caffeine (FIORICET, ESGIC) 50-325-40 MG per tablet Take 1 tablet by mouth every 4 (four) hours as needed for headache. (Patient not taking: Reported on 03/14/2016) 14 tablet 0  . ibuprofen (ADVIL,MOTRIN) 600 MG tablet Take 1 tablet (600 mg total)  by mouth every 6 (six) hours. (Patient not taking: Reported on 03/14/2016) 30 tablet 1  . oxyCODONE-acetaminophen (PERCOCET/ROXICET) 5-325 MG per tablet Take 1-2 tablets by mouth every 4 (four) hours as needed (moderate - severe pain). (Patient not taking: Reported on 03/14/2016) 30 tablet 0  . Prenatal Vit-Fe Fumarate-FA (PRENATAL MULTIVITAMIN) TABS Take 1 tablet by mouth daily. (Patient not taking: Reported on 03/14/2016) 30 tablet 4    Musculoskeletal: Strength & Muscle Tone: within normal limits Gait & Station: normal Patient leans: N/A  Psychiatric Specialty Exam: Physical Exam  Constitutional: She is oriented to person,  place, and time. She appears well-developed and well-nourished.  HENT:  Head: Normocephalic.  Respiratory: Effort normal.  Musculoskeletal: Normal range of motion.  Neurological: She is alert and oriented to person, place, and time.    Review of Systems  Psychiatric/Behavioral: Positive for depression. Negative for hallucinations, memory loss, substance abuse and suicidal ideas. The patient is nervous/anxious. The patient does not have insomnia.   All other systems reviewed and are negative.   Blood pressure (!) 142/98, pulse 69, temperature 98.4 F (36.9 C), temperature source Oral, resp. rate 14, last menstrual period 06/21/2017, SpO2 99 %, unknown if currently breastfeeding.There is no height or weight on file to calculate BMI.  General Appearance: Casual  Eye Contact:  Good  Speech:  Clear and Coherent and Normal Rate  Volume:  Normal  Mood:  Anxious  Affect:  Congruent  Thought Process:  Coherent, Goal Directed and Linear  Orientation:  Full (Time, Place, and Person)  Thought Content:  Logical  Suicidal Thoughts:  No  Homicidal Thoughts:  No  Memory:  Immediate;   Good Recent;   Good Remote;   Fair  Judgement:  Good  Insight:  Fair  Psychomotor Activity:  Normal  Concentration:  Concentration: Good and Attention Span: Good  Recall:  Good  Fund of Knowledge:  Good  Language:  Good  Akathisia:  No  Handed:  Right  AIMS (if indicated):     Assets:  Communication Skills  ADL's:  Intact  Cognition:  WNL  Sleep:   good     Treatment Plan Summary: Daily contact with patient to assess and evaluate symptoms and progress in treatment and Medication management (see MAR )  Disposition: observe in ED for 24 hours for safety and medication management  Ethelene Hal, NP 07/06/2017 11:28 AM  Patient seen face-to-face for psychiatric evaluation, chart reviewed and case discussed with the physician extender and developed treatment plan. Reviewed the information documented  and agree with the treatment plan. Corena Pilgrim, MD

## 2017-07-06 NOTE — ED Notes (Signed)
This morning pt appeared panicky and insisted that she was having chest pressure that is "Different this time. This time it really hurts too."  EKG completed was NSR. Pt could not stop moving so there was a little artifact. Pt reassured and currently resting in bed under the covers. She is very thin with scant body fat and said that she has not been able to eat in a week because of nausea. Pt encouraged to use deep breathing to help her calm down.

## 2017-07-06 NOTE — ED Notes (Signed)
Vomited after eating some dinner and drinking some Ensure.

## 2017-07-06 NOTE — ED Notes (Signed)
A&O x 3, no distress noted, calm & cooperative.  Remains nauseated, tolerating sips at present.  Monitoring for safety, Q 15 min checks in effect.

## 2017-07-06 NOTE — ED Notes (Signed)
Pt states that after drinking Ensure this afternoon she feels much better. She is trying to eat dinner and has another Ensure as well. No further complaints of nausea and the protonix helped her reflux.

## 2017-07-07 MED ORDER — MIRTAZAPINE 7.5 MG PO TABS: 7.5000 mg | ORAL_TABLET | Freq: Every day | ORAL | 0 refills | Status: DC

## 2017-07-07 MED ORDER — HYDROXYZINE HCL 25 MG PO TABS: 25.0000 mg | ORAL_TABLET | Freq: Two times a day (BID) | ORAL | 0 refills | Status: DC

## 2017-07-07 NOTE — ED Notes (Signed)
Patient alert, oriented.  Patient reports she has not eaten in a week and is only drinking a small amount of fluids.  Patient concerned about this as she reports she was cleared medically, but her conditioned has not improved.  Patient refused ensure saying she had vomited it up last night.  Patient holding up her arm and saying, "I'm worried, I've never been this small before."

## 2017-07-07 NOTE — [facility] Suicide Risk Assessment (Cosign Needed)
Suicide Risk Assessment  Discharge Assessment   [facility]   Principal Problem: Panic disorder Discharge Diagnoses:  Patient Active Problem List   Diagnosis Date Noted  . Panic disorder [F41.0] 07/06/2017  . Current moderate episode of major depressive disorder ([facility]) [F32.1]   . Panic attack [F41.0]   . Twin pregnancy, antepartum [O30.009] 10/12/2011  . Short cervix affecting pregnancy [O26.879] 10/12/2011    Total Time spent with patient: 30 minutes  Musculoskeletal: Strength & Muscle Tone: within normal limits Gait & Station: normal Patient leans: N/A  Psychiatric Specialty Exam: Physical Exam  Constitutional: She is oriented to person, place, and time. She appears well-developed and well-nourished.  HENT:  Head: Normocephalic.  Respiratory: Effort normal.  Musculoskeletal: Normal range of motion.  Neurological: She is alert and oriented to person, place, and time.   Review of Systems  Psychiatric/Behavioral: Positive for depression. Negative for hallucinations, memory loss, substance abuse and suicidal ideas. The patient is nervous/anxious. The patient does not have insomnia.   All other systems reviewed and are negative.  Blood pressure (!) 160/87, pulse 69, temperature 97.6 F (36.4 C), temperature source Oral, resp. rate 18, last menstrual period 06/21/2017, SpO2 100 %, unknown if currently breastfeeding.There is no height or weight on file to calculate BMI. General Appearance: Casual Eye Contact:  Good Speech:  Clear and Coherent and Normal Rate Volume:  Normal Mood:  Anxious Affect:  Congruent Thought Process:  Coherent, Goal Directed and Linear Orientation:  Full (Time, Place, and Person) Thought Content:  Logical Suicidal Thoughts:  No Homicidal Thoughts:  No Memory:  Immediate;   Good Recent;   Good Remote;   Fair Judgement:  Good Insight:  Fair Psychomotor Activity:  Normal Concentration:  Concentration: Good and Attention  Span: Good Recall:  Good Fund of Knowledge:  Good Language:  Good Akathisia:  No Handed:  Right AIMS (if indicated):    Assets:  Communication Skills ADL's:  Intact Cognition:  WNL Sleep:   good   Mental Status Per Nursing Assessment::   On Admission:   Panic attack  Demographic Factors:  Divorced or widowed and Caucasian  Loss Factors: Financial problems/change in socioeconomic status  Historical Factors: Impulsivity  Risk Reduction Factors:   Responsible for children under 32 years of age, Sense of responsibility to family, Employed and Living with another person, especially a relative  Continued Clinical Symptoms:  Panic Attacks  Cognitive Features That Contribute To Risk:  Closed-mindedness    Suicide Risk:  Minimal: No identifiable suicidal ideation.  Patients presenting with no risk factors but with morbid ruminations; may be classified as minimal risk based on the severity of the depressive symptoms    Plan Of Care/Follow-up recommendations:  Activity:  as tolerated  Diet:  Heart healthy  Laveda AbbeLaurie Britton Shauntae Reitman, NP 07/07/2017, 10:55 AM

## 2017-07-07 NOTE — Discharge Instructions (Signed)
Follow up with:   [facility] [address] [place], [place] [address] [phone] Description: A variety of behavioral health services are offered, including: Open Access  Outpatient Therapy & Psychiatric Services  Assertive Community Treatment Team (ACTT) (adults only)  Crisis Assessment Services Center (children & adults)  Assertive Engagement (children & adults)  Peer Support Services Referrals may be made to the facility-based crisis center 24/7, 365 days a year. Walk-ins are always welcome or you may call ahead to speak with a [facility] staff member regarding availability (preferred method  Follow up with a Primary care provider, of your choice, for a medical evaluation to address your nausea and vomiting after meals.

## 2017-07-07 NOTE — ED Notes (Signed)
Patient taking a shower.

## 2017-07-07 NOTE — Consult Note (Addendum)
[facility] Consult   Reason for Consult:  Anxious Referring Physician:  EDP Patient Identification: [name] MRN:  [id number] Principal Diagnosis: Panic disorder Diagnosis:   Patient Active Problem List   Diagnosis Date Noted  . Panic disorder [F41.0] 07/06/2017  . Current moderate episode of major depressive disorder ([place]) [F32.1]   . Panic attack [F41.0]   . Twin pregnancy, antepartum [O30.009] 10/12/2011  . Short cervix affecting pregnancy [O26.879] 10/12/2011    Total Time spent with patient: 30 minutes  Subjective:   [name] is a 32 y.o. female patient admitted with severe anxiety.  HPI:   Pt was seen and chart reviewed with treatment team and Dr Darleene Cleaver. Pt denies suicidal/homicidal ideation, denies auditory/visual hallucinations and does not appear to be responding to internal stimuli. Pt stated she has had anxiety and panic attacks off and on for quite a while. Her OB/GYN put her on Paxil and Xanax four months ago. She stated that on Saturday she started having a panic attack and has feels like it won't go away. Pt stated that she has tightness in her chest and then nausea and she forces herself to vomit. Pt stated she has lost weight in the past month and just wants some help. Pt's UDS positive for benzos and THC, BAL negative. Pt's medications were adjusted in the ED and Pt stated she slept well last night. Pt is still endorsing nausea and some vomiting after meals. Pt instructed to follow up with a PCP for a medical evaluation. Pt is stable and psychiatrically clear for discharge.  Past Psychiatric History: As above  Risk to Self: None Risk to Others: None Prior Inpatient Therapy: Prior Inpatient Therapy: No Prior Outpatient Therapy: Prior Outpatient Therapy: No Does patient have an ACCT team?: No Does patient have Intensive In-House Services?  : No Does patient have [facility] services? : No Does patient have P4CC services?: No  Past  Medical History:  Past Medical History:  Diagnosis Date  . Preterm labor     Past Surgical History:  Procedure Laterality Date  . CESAREAN SECTION  10/23/2011   Procedure: CESAREAN SECTION;  Surgeon: Luz Lex, MD;  Location: [facility];  Service: Obstetrics;  Laterality: N/A;  . DILATION AND CURETTAGE OF UTERUS  2006   Family History:  Family History  Problem Relation Age of Onset  . Alcohol abuse Father   . Cancer Father   . Early death Father    Family Psychiatric  History: Unknown Social History:  Social History   Substance and Sexual Activity  Alcohol Use No     Social History   Substance and Sexual Activity  Drug Use No    Social History   Socioeconomic History  . Marital status: Single    Spouse name: Not on file  . Number of children: Not on file  . Years of education: Not on file  . Highest education level: Not on file  Occupational History  . Not on file  Social Needs  . Financial resource strain: Not on file  . Food insecurity:    Worry: Not on file    Inability: Not on file  . Transportation needs:    Medical: Not on file    Non-medical: Not on file  Tobacco Use  . Smoking status: Never Smoker  . Smokeless tobacco: Never Used  Substance and Sexual Activity  . Alcohol use: No  . Drug use: No  . Sexual activity: Not Currently  Birth control/protection: Abstinence  Lifestyle  . Physical activity:    Days per week: Not on file    Minutes per session: Not on file  . Stress: Not on file  Relationships  . Social connections:    Talks on phone: Not on file    Gets together: Not on file    Attends religious service: Not on file    Active member of club or organization: Not on file    Attends meetings of clubs or organizations: Not on file    Relationship status: Not on file  Other Topics Concern  . Not on file  Social History Narrative  . Not on file   Additional Social History:    Allergies:   Allergies  Allergen Reactions  . Latex  Itching    Labs:  Results for orders placed or performed during the hospital encounter of 07/05/17 (from the past 48 hour(s))  Comprehensive metabolic panel     Status: Abnormal   Collection Time: 07/05/17  2:37 PM  Result Value Ref Range   Sodium 137 135 - 145 mmol/L   Potassium 3.2 (L) 3.5 - 5.1 mmol/L   Chloride 102 101 - 111 mmol/L   CO2 25 22 - 32 mmol/L   Glucose, Bld 121 (H) 65 - 99 mg/dL   BUN 17 6 - 20 mg/dL   Creatinine, Ser 0.65 0.44 - 1.00 mg/dL   Calcium 10.0 8.9 - 10.3 mg/dL   Total Protein 8.2 (H) 6.5 - 8.1 g/dL   Albumin 5.1 (H) 3.5 - 5.0 g/dL   AST 17 15 - 41 U/L   ALT 17 14 - 54 U/L   Alkaline Phosphatase 46 38 - 126 U/L   Total Bilirubin 1.8 (H) 0.3 - 1.2 mg/dL   GFR calc non Af Amer >60 >60 mL/min   GFR calc Af Amer >60 >60 mL/min    Comment: (NOTE) The eGFR has been calculated using the CKD EPI equation. This calculation has not been validated in all clinical situations. eGFR's persistently <60 mL/min signify possible Chronic Kidney Disease.    Anion gap 10 5 - 15    Comment: Performed at [facility], [place] [address] [place], [place] [address]  Ethanol     Status: None   Collection Time: 07/05/17  2:37 PM  Result Value Ref Range   Alcohol, Ethyl (B) <10 <10 mg/dL    Comment: (NOTE) Lowest detectable limit for serum alcohol is 10 mg/dL. For medical purposes only. Performed at [facility], [place] [address], [place], [place] [address]   Urine rapid drug screen (hosp performed)     Status: Abnormal   Collection Time: 07/05/17  2:37 PM  Result Value Ref Range   Opiates NONE DETECTED NONE DETECTED   Cocaine NONE DETECTED NONE DETECTED   Benzodiazepines POSITIVE (A) NONE DETECTED   Amphetamines NONE DETECTED NONE DETECTED   Tetrahydrocannabinol POSITIVE (A) NONE DETECTED   Barbiturates (A) NONE DETECTED    Result not available. Reagent lot number recalled by manufacturer.    Comment: Performed at [facility], [place] [address] [place], [place] [address]  CBC with Diff     Status: Abnormal   Collection Time: 07/05/17  2:37 PM  Result Value Ref Range   WBC 7.7 4.0 - 10.5 K/uL   RBC 5.58 (H) 3.87 - 5.11 MIL/uL   Hemoglobin 16.0 (H) 12.0 - 15.0 g/dL   HCT 45.8 36.0 - 46.0 %   MCV 82.1  78.0 - 100.0 fL   MCH 28.7 26.0 - 34.0 pg   MCHC 34.9 30.0 - 36.0 g/dL   RDW 12.8 11.5 - 15.5 %   Platelets 292 150 - 400 K/uL   Neutrophils Relative % 80 %   Neutro Abs 6.1 1.7 - 7.7 K/uL   Lymphocytes Relative 13 %   Lymphs Abs 1.0 0.7 - 4.0 K/uL   Monocytes Relative 7 %   Monocytes Absolute 0.5 0.1 - 1.0 K/uL   Eosinophils Relative 0 %   Eosinophils Absolute 0.0 0.0 - 0.7 K/uL   Basophils Relative 0 %   Basophils Absolute 0.0 0.0 - 0.1 K/uL    Comment: Performed at [facility], [place] [address] [place], [place] [address]  Urinalysis, Routine w reflex microscopic     Status: Abnormal   Collection Time: 07/05/17  2:37 PM  Result Value Ref Range   Color, Urine YELLOW YELLOW   APPearance TURBID (A) CLEAR   Specific Gravity, Urine 1.024 1.005 - 1.030   pH 8.0 5.0 - 8.0   Glucose, UA NEGATIVE NEGATIVE mg/dL   Hgb urine dipstick SMALL (A) NEGATIVE   Bilirubin Urine NEGATIVE NEGATIVE   Ketones, ur 20 (A) NEGATIVE mg/dL   Protein, ur NEGATIVE NEGATIVE mg/dL   Nitrite NEGATIVE NEGATIVE   Leukocytes, UA NEGATIVE NEGATIVE   RBC / HPF 11-20 0 - 5 RBC/hpf   Bacteria, UA FEW (A) NONE SEEN   Squamous Epithelial / LPF 6-10 0 - 5   Mucus PRESENT    Amorphous Crystal PRESENT     Comment: Performed at [facility], [place] [address], [place], [place] [address]  TSH     Status: None   Collection Time: 07/05/17  2:37 PM  Result Value Ref Range   TSH 0.489 0.350 - 4.500 uIU/mL    Comment: Performed by a 3rd Generation assay with a functional sensitivity of <=0.01 uIU/mL. Performed at [facility], [place] [address] [place], [place] [address]    Lipase, blood     Status: None   Collection Time: 07/05/17  2:37 PM  Result Value Ref Range   Lipase 30 11 - 51 U/L    Comment: Performed at [facility], [place] [address], [place],  [address]  Pregnancy, urine     Status: None   Collection Time: 07/05/17  2:37 PM  Result Value Ref Range   Preg Test, Ur NEGATIVE NEGATIVE    Comment:        THE SENSITIVITY OF THIS METHODOLOGY IS >20 mIU/mL. Performed at [facility], [place] [address], [place],  [address]     Current Facility-Administered Medications  Medication Dose Route Frequency Provider Last Rate Last Dose  . feeding supplement (ENSURE ENLIVE) (ENSURE ENLIVE) liquid 237 mL  237 mL Oral TID PC & HS Ethelene Hal, NP   237 mL at 07/06/17 2134  . hydrOXYzine (ATARAX/VISTARIL) tablet 25 mg  25 mg Oral BID Corena Pilgrim, MD   25 mg at 07/07/17 0926  . mirtazapine (REMERON) tablet 7.5 mg  7.5 mg Oral QHS Elani Delph, MD   7.5 mg at 07/06/17 2134  . ondansetron (ZOFRAN-ODT) disintegrating tablet 4 mg  4 mg Oral Q8H PRN Deno Etienne, DO   4 mg at 07/07/17 0600   Current Outpatient Medications  Medication Sig Dispense Refill  . ALPRAZolam (XANAX) 0.25 MG tablet Take 0.25 mg by mouth 3 (three) times daily as needed for anxiety.  0  . PARoxetine (PAXIL-CR) 12.5 MG 24 hr tablet Take 12.5 mg by mouth daily.  12  . butalbital-acetaminophen-caffeine (FIORICET, ESGIC) 50-325-40 MG per tablet Take 1 tablet by mouth every 4 (four) hours as needed for headache. (Patient not taking: Reported on 03/14/2016) 14 tablet 0  . hydrOXYzine (ATARAX/VISTARIL) 25 MG tablet Take 1 tablet (25 mg total) by mouth 2 (two) times daily. 30 tablet 0  . ibuprofen (ADVIL,MOTRIN) 600 MG tablet Take 1 tablet (600 mg total) by mouth every 6 (six) hours. (Patient not taking: Reported on 03/14/2016) 30 tablet 1  . mirtazapine (REMERON) 7.5 MG tablet Take 1 tablet (7.5 mg total) by mouth at bedtime. 15 tablet 0  .  oxyCODONE-acetaminophen (PERCOCET/ROXICET) 5-325 MG per tablet Take 1-2 tablets by mouth every 4 (four) hours as needed (moderate - severe pain). (Patient not taking: Reported on 03/14/2016) 30 tablet 0  . Prenatal Vit-Fe Fumarate-FA (PRENATAL MULTIVITAMIN) TABS Take 1 tablet by mouth daily. (Patient not taking: Reported on 03/14/2016) 30 tablet 4    Musculoskeletal: Strength & Muscle Tone: within normal limits Gait & Station: normal Patient leans: N/A  Psychiatric Specialty Exam: Physical Exam  Constitutional: She is oriented to person, place, and time. She appears well-developed and well-nourished.  HENT:  Head: Normocephalic.  Respiratory: Effort normal.  Musculoskeletal: Normal range of motion.  Neurological: She is alert and oriented to person, place, and time.    Review of Systems  Psychiatric/Behavioral: Positive for depression. Negative for hallucinations, memory loss, substance abuse and suicidal ideas. The patient is nervous/anxious. The patient does not have insomnia.   All other systems reviewed and are negative.   Blood pressure (!) 160/87, pulse 69, temperature 97.6 F (36.4 C), temperature source Oral, resp. rate 18, last menstrual period 06/21/2017, SpO2 100 %, unknown if currently breastfeeding.There is no height or weight on file to calculate BMI.  General Appearance: Casual  Eye Contact:  Good  Speech:  Clear and Coherent and Normal Rate  Volume:  Normal  Mood:  Anxious  Affect:  Congruent  Thought Process:  Coherent, Goal Directed and Linear  Orientation:  Full (Time, Place, and Person)  Thought Content:  Logical  Suicidal Thoughts:  No  Homicidal Thoughts:  No  Memory:  Immediate;   Good Recent;   Good Remote;   Fair  Judgement:  Good  Insight:  Fair  Psychomotor Activity:  Normal  Concentration:  Concentration: Good and Attention Span: Good  Recall:  Good  Fund of Knowledge:  Good  Language:  Good  Akathisia:  No  Handed:  Right  AIMS (if indicated):      Assets:  Communication Skills  ADL's:  Intact  Cognition:  WNL  Sleep:   good     Treatment Plan Summary: Plan Panic disorder  Discharge Home Follow up with [facility] for medication management and therapy Take all medications as prescribed  Disposition: No evidence of imminent risk to self or others at present.   Patient does not meet criteria for psychiatric inpatient admission. Supportive therapy provided about ongoing stressors. Discussed crisis plan, support from social network, calling 911, coming to the Emergency Department, and calling Suicide Hotline.  Ethelene Hal, NP 07/07/2017 10:51 AM  Patient seen face-to-face for psychiatric evaluation, chart reviewed and case discussed with the physician extender and developed treatment plan. Reviewed the information documented and agree with the treatment plan. Corena Pilgrim, MD

## 2019-02-17 ENCOUNTER — Encounter: Payer: Self-pay | Admitting: Family Medicine

## 2019-10-25 IMAGING — CR DG CHEST 2V
2 series · 2 of 2 positions shown · non-contrast
Comparison: None.

CLINICAL DATA: Panic attack

EXAM:
CHEST - 2 VIEW

[w chest pa]
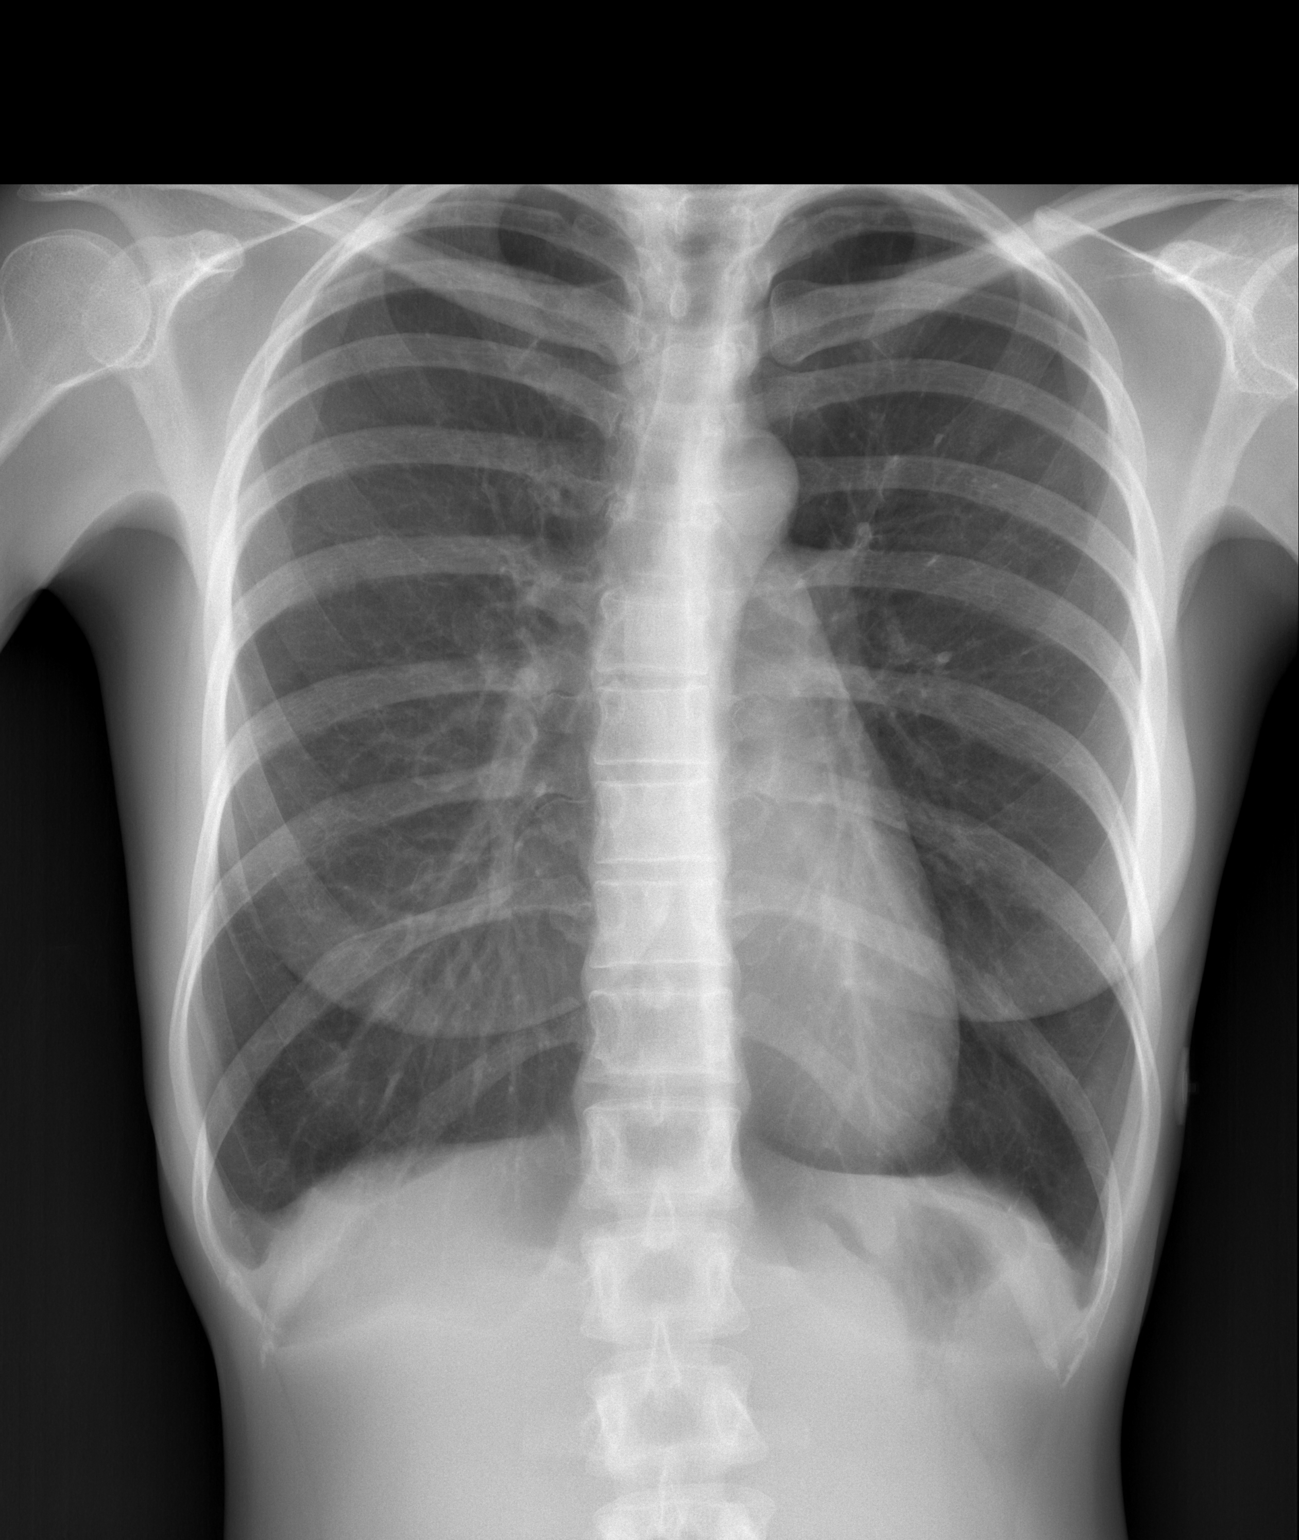

[w chest lat]
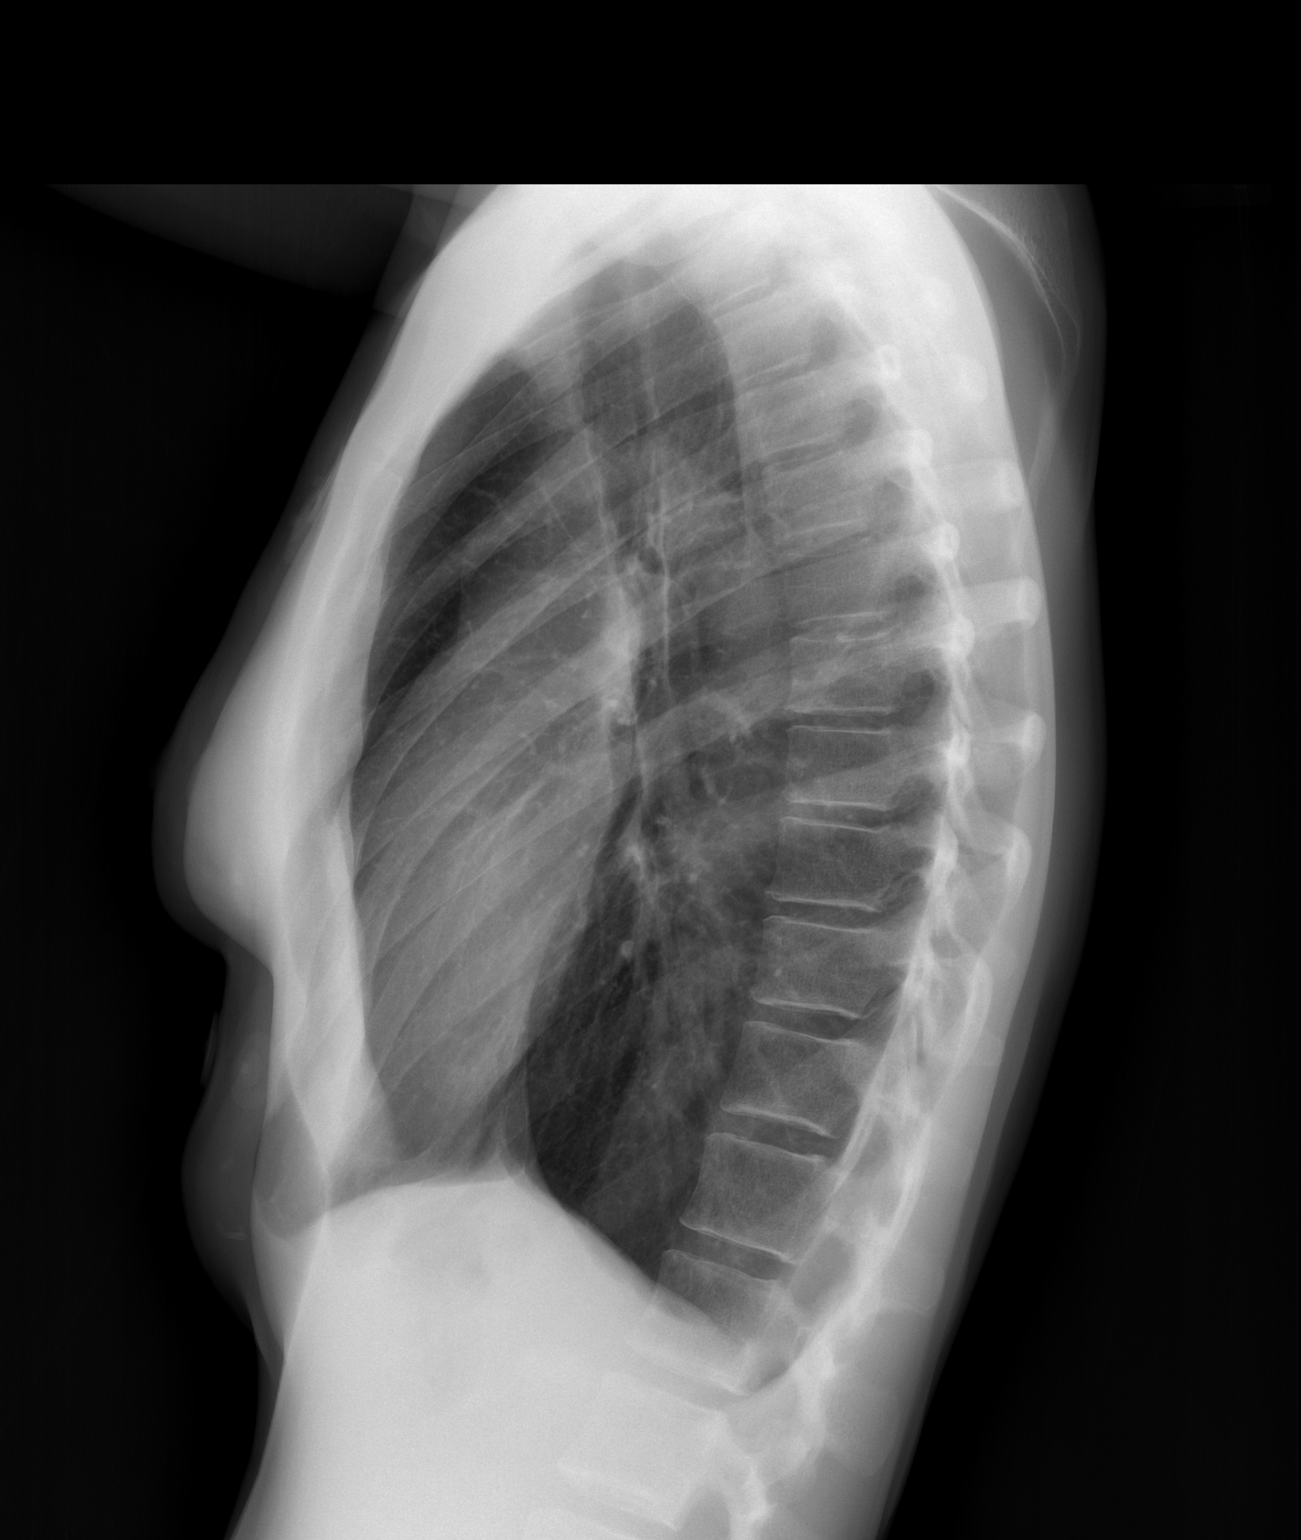

[2 of 2 positions shown; findings below may reference images not displayed]

FINDINGS: Marked pulmonary hyperinflation suggesting asthma. No focal
infiltrate or effusion. Heart size not enlarged. No mass or
adenopathy
IMPRESSION: Marked hyperinflation suggesting air trapping and or asthma.

## 2021-09-30 ENCOUNTER — Emergency Department (HOSPITAL_COMMUNITY): Payer: Self-pay

## 2021-09-30 ENCOUNTER — Other Ambulatory Visit: Payer: Self-pay

## 2021-09-30 ENCOUNTER — Encounter (HOSPITAL_COMMUNITY): Payer: Self-pay | Admitting: Emergency Medicine

## 2021-09-30 ENCOUNTER — Emergency Department (HOSPITAL_COMMUNITY)
Admission: EM | Admit: 2021-09-30 | Discharge: 2021-09-30 | Disposition: A | Discharge status: Home / Self Care | Payer: Self-pay | Attending: Emergency Medicine | Admitting: Emergency Medicine

## 2021-09-30 DIAGNOSIS — N83209 Unspecified ovarian cyst, unspecified side: Secondary | ICD-10-CM

## 2021-09-30 DIAGNOSIS — Z91040 Latex allergy status: Secondary | ICD-10-CM | POA: Insufficient documentation

## 2021-09-30 DIAGNOSIS — A084 Viral intestinal infection, unspecified: Secondary | ICD-10-CM | POA: Insufficient documentation

## 2021-09-30 DIAGNOSIS — N83201 Unspecified ovarian cyst, right side: Secondary | ICD-10-CM | POA: Insufficient documentation

## 2021-09-30 DIAGNOSIS — K2970 Gastritis, unspecified, without bleeding: Secondary | ICD-10-CM

## 2021-09-30 DIAGNOSIS — Z20822 Contact with and (suspected) exposure to covid-19: Secondary | ICD-10-CM | POA: Insufficient documentation

## 2021-09-30 DIAGNOSIS — R0789 Other chest pain: Secondary | ICD-10-CM | POA: Insufficient documentation

## 2021-09-30 HISTORY — DX: Anxiety disorder, unspecified: F41.9

## 2021-09-30 LAB — CBC
HCT: 34.5 % — ABNORMAL LOW (ref 36.0–46.0)
Hemoglobin: 11.7 g/dL — ABNORMAL LOW (ref 12.0–15.0)
MCH: 28.1 pg (ref 26.0–34.0)
MCHC: 33.9 g/dL (ref 30.0–36.0)
MCV: 82.9 fL (ref 80.0–100.0)
Platelets: 224 10*3/uL (ref 150–400)
RBC: 4.16 MIL/uL (ref 3.87–5.11)
RDW: 13.2 % (ref 11.5–15.5)
WBC: 8.2 10*3/uL (ref 4.0–10.5)
nRBC: 0 % (ref 0.0–0.2)

## 2021-09-30 LAB — COMPREHENSIVE METABOLIC PANEL
ALT: 15 U/L (ref 0–44)
AST: 18 U/L (ref 15–41)
Albumin: 4 g/dL (ref 3.5–5.0)
Alkaline Phosphatase: 31 U/L — ABNORMAL LOW (ref 38–126)
Anion gap: 9 (ref 5–15)
BUN: 18 mg/dL (ref 6–20)
CO2: 21 mmol/L — ABNORMAL LOW (ref 22–32)
Calcium: 8.2 mg/dL — ABNORMAL LOW (ref 8.9–10.3)
Chloride: 106 mmol/L (ref 98–111)
Creatinine, Ser: 0.63 mg/dL (ref 0.44–1.00)
GFR, Estimated: >60 mL/min (ref >60)
Glucose, Bld: 108 mg/dL — ABNORMAL HIGH (ref 70–99)
Potassium: 3.4 mmol/L — ABNORMAL LOW (ref 3.5–5.1)
Sodium: 136 mmol/L (ref 135–145)
Total Bilirubin: 1.4 mg/dL — ABNORMAL HIGH (ref 0.3–1.2)
Total Protein: 6.3 g/dL — ABNORMAL LOW (ref 6.5–8.1)

## 2021-09-30 LAB — URINALYSIS, ROUTINE W REFLEX MICROSCOPIC
Bilirubin Urine: NEGATIVE
Glucose, UA: NEGATIVE mg/dL
Hgb urine dipstick: NEGATIVE
Ketones, ur: 80 mg/dL — AB
Leukocytes,Ua: NEGATIVE
Nitrite: NEGATIVE
Protein, ur: 100 mg/dL — AB
Specific Gravity, Urine: 1.029 (ref 1.005–1.030)
pH: 8 (ref 5.0–8.0)

## 2021-09-30 LAB — RESP PANEL BY RT-PCR (FLU A&B, COVID) ARPGX2
Influenza A by PCR: NEGATIVE
Influenza B by PCR: NEGATIVE
SARS Coronavirus 2 by RT PCR: NEGATIVE

## 2021-09-30 LAB — LIPASE, BLOOD: Lipase: 28 U/L (ref 11–51)

## 2021-09-30 LAB — POC URINE PREG, ED: Preg Test, Ur: NEGATIVE

## 2021-09-30 MED ORDER — ONDANSETRON 4 MG PO TBDP: 4.0000 mg | ORAL_TABLET | Freq: Once | ORAL | Status: DC | PRN

## 2021-09-30 MED ORDER — HYDROXYZINE HCL 25 MG PO TABS
25.0000 mg | ORAL_TABLET | Freq: Once | ORAL | Status: AC
  Administered 2021-09-30: 25 mg via ORAL
  Filled 2021-09-30: qty 1

## 2021-09-30 MED ORDER — SODIUM CHLORIDE 0.9 % IV BOLUS
1000.0000 mL | Freq: Once | INTRAVENOUS | Status: AC
  Administered 2021-09-30: 1000 mL via INTRAVENOUS

## 2021-09-30 MED ORDER — ONDANSETRON 4 MG PO TBDP: 4.0000 mg | ORAL_TABLET | Freq: Three times a day (TID) | ORAL | 0 refills | Status: AC | PRN

## 2021-09-30 MED ORDER — METOCLOPRAMIDE HCL 5 MG/ML IJ SOLN
10.0000 mg | Freq: Once | INTRAMUSCULAR | Status: AC
  Administered 2021-09-30: 10 mg via INTRAVENOUS
  Filled 2021-09-30: qty 2

## 2021-09-30 MED ORDER — HYDROXYZINE HCL 25 MG PO TABS: 25.0000 mg | ORAL_TABLET | Freq: Every day | ORAL | 0 refills | Status: AC

## 2021-09-30 MED ORDER — KETOROLAC TROMETHAMINE 15 MG/ML IJ SOLN
15.0000 mg | Freq: Once | INTRAMUSCULAR | Status: AC
  Administered 2021-09-30: 15 mg via INTRAVENOUS
  Filled 2021-09-30: qty 1

## 2021-09-30 MED ORDER — IOHEXOL 300 MG/ML  SOLN
100.0000 mL | Freq: Once | INTRAMUSCULAR | Status: AC | PRN
  Administered 2021-09-30: 100 mL via INTRAVENOUS

## 2021-09-30 MED ORDER — POTASSIUM CHLORIDE CRYS ER 20 MEQ PO TBCR
40.0000 meq | EXTENDED_RELEASE_TABLET | Freq: Once | ORAL | Status: AC
  Administered 2021-09-30: 40 meq via ORAL
  Filled 2021-09-30: qty 2

## 2021-09-30 MED ORDER — ONDANSETRON HCL 4 MG/2ML IJ SOLN
4.0000 mg | Freq: Once | INTRAMUSCULAR | Status: AC
  Administered 2021-09-30: 4 mg via INTRAVENOUS
  Filled 2021-09-30: qty 2

## 2021-09-30 NOTE — ED Provider Notes (Signed)
[facility] EMERGENCY DEPARTMENT Provider Note   CSN: [id number] Arrival date & time: 09/30/21  0555     History  Chief Complaint  Patient presents with   Abdominal Pain    [name] is a 36 y.o. female.  HPI 36 year old female presents with abdominal pain and vomiting.  Started 2 days ago.  Has had multiple episodes of emesis and has had a fever up to 102.  Fever was as recently as yesterday.  She is also having diffuse/generalized abdominal pain.  She denies cough, sore throat, or urinary symptoms.  No diarrhea.  This morning she has developed some chest tightness that feels like her anxiety.  She states she has been out of her hydroxyzine for about 5 days and has not been able to afford the refill.  She wonders if she is having withdrawal.  She thinks that is what is causing the chest tightness.  No back pain.  Home Medications Prior to Admission medications   Medication Sig Start Date End Date Taking? Authorizing Provider  escitalopram (LEXAPRO) 10 MG tablet Take 10 mg by mouth daily. 05/09/21  Yes [provider]  ondansetron (ZOFRAN-ODT) 4 MG disintegrating tablet Take 1 tablet (4 mg total) by mouth every 8 (eight) hours as needed for nausea or vomiting. 09/30/21  Yes Pricilla Loveless, MD  hydrOXYzine (ATARAX) 25 MG tablet Take 1 tablet (25 mg total) by mouth at bedtime. 09/30/21   Pricilla Loveless, MD      Allergies    Latex and Xanax [alprazolam]    Review of Systems   Review of Systems  Constitutional:  Positive for chills and fever.  HENT:  Negative for sore throat.   Respiratory:  Positive for chest tightness. Negative for cough.   Gastrointestinal:  Positive for abdominal pain, nausea and vomiting. Negative for diarrhea.  Genitourinary:  Negative for dysuria.  Musculoskeletal:  Negative for back pain.    Physical Exam Updated Vital Signs BP 108/61 (BP Location: Left Arm)   Pulse (!) 56   Temp 99 F (37.2 C) (Oral)   Resp 15   Ht 5\' 6"  (1.676 m)    Wt 54.4 kg   LMP 09/04/2021 (Exact Date)   SpO2 100%   BMI 19.37 kg/m  Physical Exam Vitals and nursing note reviewed.  Constitutional:      Appearance: She is well-developed. She is not ill-appearing or diaphoretic.  HENT:     Head: Normocephalic and atraumatic.  Cardiovascular:     Rate and Rhythm: Normal rate and regular rhythm.     Heart sounds: Normal heart sounds.  Pulmonary:     Effort: Pulmonary effort is normal.     Breath sounds: Normal breath sounds.  Abdominal:     Palpations: Abdomen is soft.     Tenderness: There is generalized abdominal tenderness.  Skin:    General: Skin is warm and dry.  Neurological:     Mental Status: She is alert.     ED Results / Procedures / Treatments   Labs (all labs ordered are listed, but only abnormal results are displayed) Labs Reviewed  COMPREHENSIVE METABOLIC PANEL - Abnormal; Notable for the following components:      Result Value   Potassium 3.4 (*)    CO2 21 (*)    Glucose, Bld 108 (*)    Calcium 8.2 (*)    Total Protein 6.3 (*)    Alkaline Phosphatase 31 (*)    Total Bilirubin 1.4 (*)    All other  components within normal limits  CBC - Abnormal; Notable for the following components:   Hemoglobin 11.7 (*)    HCT 34.5 (*)    All other components within normal limits  URINALYSIS, ROUTINE W REFLEX MICROSCOPIC - Abnormal; Notable for the following components:   APPearance CLOUDY (*)    Ketones, ur 80 (*)    Protein, ur 100 (*)    Bacteria, UA RARE (*)    All other components within normal limits  RESP PANEL BY RT-PCR (FLU A&B, COVID) ARPGX2  LIPASE, BLOOD  POC URINE PREG, ED    EKG EKG Interpretation  Date/Time:  Friday September 30 2021 06:12:27 EDT Ventricular Rate:  64 PR Interval:  150 QRS Duration: 93 QT Interval:  392 QTC Calculation: 405 R Axis:   86 Text Interpretation: Sinus arrhythmia Anteroseptal infarct, age indeterminate When compared with ECG of 07/06/2017, No significant change was found  Confirmed by Delora Fuel ([id number]) on 09/30/2021 6:44:14 AM  Radiology US PELVIC COMPLETE W TRANSVAGINAL AND TORSION R/O  Result Date: 09/30/2021 CLINICAL DATA:  Pelvic pain, fever, abnormal CT; LMP 09/04/2021; past history D&C , Caesarean section EXAM: TRANSABDOMINAL AND TRANSVAGINAL ULTRASOUND OF PELVIS DOPPLER ULTRASOUND OF OVARIES TECHNIQUE: Both transabdominal and transvaginal ultrasound examinations of the pelvis were performed. Transabdominal technique was performed for global imaging of the pelvis including uterus, ovaries, adnexal regions, and pelvic cul-de-sac. It was necessary to proceed with endovaginal exam following the transabdominal exam to visualize the endometrium and adnexa. Color and duplex Doppler ultrasound was utilized to evaluate blood flow to the ovaries. COMPARISON:  CT abdomen and pelvis 09/30/2021 FINDINGS: Uterus Measurements: 8.3 x 4.7 x 5.2 cm = volume: 106 mL. Anteverted. Normal morphology without mass Endometrium Thickness: 7 mm.  No endometrial fluid or mass Right ovary Measurements: 4.4 x 3.2 x 3.7 cm = volume: 26.8 mL. Simple cyst RIGHT ovary 3.1 x 2.8 x 2.8 cm; 3.9 x 2.8 x 3.5 cm note: This recommendation does not apply to premenarchal patients or to those with increased risk (genetic, family history, elevated tumor markers or other high-risk factors) of ovarian cancer. Reference: Radiology 2019 Nov; 293(2):359-371. Blood flow present within RIGHT ovary on color Doppler imaging. Left ovary Measurements: 3.9 x 2.8 x 3.8 cm = volume: 20.0 mL. Complex mass within LEFT ovary measuring 3.6 x 3.0 cm containing a hypoechoic cystic component as well as markedly hyperechoic regions with posterior shadowing consistent with dermoid tumor and corresponding to fat attenuation mass seen on CT. Pulsed Doppler evaluation of both ovaries demonstrates normal low-resistance arterial and venous waveforms. Other findings Trace free pelvic fluid. No other pelvic masses. Patient tender with  transvaginal imaging. IMPRESSION: Simple cyst RIGHT ovary 3.9 cm greatest size; no follow-up imaging recommended. 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 No evidence of ovarian torsion. Unremarkable uterus and endometrial complex. Electronically Signed   By: Lavonia Dana M.D.   On: 09/30/2021 12:56   CT ABDOMEN PELVIS W CONTRAST  Result Date: 09/30/2021 CLINICAL DATA:  Nausea, vomiting. EXAM: CT ABDOMEN AND PELVIS WITH CONTRAST TECHNIQUE: Multidetector CT imaging of the abdomen and pelvis was performed using the standard protocol following bolus administration of intravenous contrast. RADIATION DOSE REDUCTION: This exam was performed according to the departmental dose-optimization program which includes automated exposure control, adjustment of the mA and/or kV according to patient size and/or use of iterative reconstruction technique. CONTRAST:  176mL OMNIPAQUE IOHEXOL 300 MG/ML  SOLN COMPARISON:  None Available. FINDINGS: Lower chest: No acute abnormality. Hepatobiliary: No focal  liver abnormality is seen. No gallstones, gallbladder wall thickening, or biliary dilatation. Pancreas: Unremarkable. No pancreatic ductal dilatation or surrounding inflammatory changes. Spleen: Normal in size without focal abnormality. Adrenals/Urinary Tract: Adrenal glands are unremarkable. Kidneys are normal, without renal calculi, focal lesion, or hydronephrosis. Bladder is unremarkable. Stomach/Bowel: Stomach is within normal limits. Appendix appears normal. No evidence of bowel wall thickening, distention, or inflammatory changes. Vascular/Lymphatic: No significant vascular findings are present. No enlarged abdominal or pelvic lymph nodes. Reproductive: Uterus is unremarkable. Right ovarian simple cyst measuring at least 3.2 cm. There is also a left ovarian complex cyst containing fat measuring up to 2.6 cm, likely a dermoid cyst. Bilateral tubal ligation, Essure devices. Other: No abdominal wall hernia or abnormality. No  abdominopelvic ascites. Musculoskeletal: No acute or significant osseous findings. IMPRESSION: 1. Bowel loops are normal in caliber. Normal appendix. No evidence of colitis or diverticulitis. 2.  No evidence of cholelithiasis or acute cholecystitis. 3. Symmetric perfusion of bilateral kidneys. No evidence of nephrolithiasis or hydronephrosis. 4. Right ovarian simple cyst measuring up to 3.2 cm and left ovarian complex cyst containing fat sat calcifications measuring up to 2.6 cm likely a dermoid. Pelvic sonogram for further management evaluation would be helpful. Electronically Signed   By: Keane Police D.O.   On: 09/30/2021 11:07   DG Chest Portable 1 View  Result Date: 09/30/2021 CLINICAL DATA:  Fever, chest pain. EXAM: PORTABLE CHEST 1 VIEW COMPARISON:  July 05, 2017. FINDINGS: The heart size and mediastinal contours are within normal limits. Both lungs are clear. Hyperinflation is noted. The visualized skeletal structures are unremarkable. IMPRESSION: Hyperinflation of the lungs.  No acute abnormalities noted. Electronically Signed   By: Marijo Conception M.D.   On: 09/30/2021 09:29    Procedures Procedures    Medications Ordered in ED Medications  sodium chloride 0.9 % bolus 1,000 mL (0 mLs Intravenous Stopped 09/30/21 1005)  ketorolac (TORADOL) 15 MG/ML injection 15 mg (15 mg Intravenous Given 09/30/21 0821)  metoCLOPramide (REGLAN) injection 10 mg (10 mg Intravenous Given 09/30/21 0821)  hydrOXYzine (ATARAX) tablet 25 mg (25 mg Oral Given 09/30/21 1237)  ondansetron (ZOFRAN) injection 4 mg (4 mg Intravenous Given 09/30/21 1005)  potassium chloride SA (KLOR-CON M) CR tablet 40 mEq (40 mEq Oral Given 09/30/21 1237)  iohexol (OMNIPAQUE) 300 MG/ML solution 100 mL (100 mLs Intravenous Contrast Given 09/30/21 1037)    ED Course/ Medical Decision Making/ A&P                           Medical Decision Making Amount and/or Complexity of Data Reviewed Labs: ordered.    Details: COVID/flu negative.  UA  not consistent with UTI.  Normal WBC.   Radiology: ordered and independent interpretation performed.    Details: Chest x-ray without pneumonia.  CT without appendicitis or other acute emergent condition.  Nonspecific ovarian cysts which were followed up with ultrasound ECG/medicine tests: independent interpretation performed.    Details: No ischemia  Risk Prescription drug management.   Overall, I suspect that patient seems to have a viral process causing fever and vomiting.  No other obvious source and no obvious bacterial illness noted.  Patient denies any pelvic complaints.  Ultrasound obtained given the findings on CT but the cyst overall appears uncomplicated.  Again, she denies pelvic complaints I think PID or STI is unlikely.  Not consistent with TOA.  She declines pelvic exam.  She is feeling a lot better and so  we will discharge with Zofran and a prescription of Hydroxyzine. Needs to follow up with PCP for further meds as needed. Given return precautions.         Final Clinical Impression(s) / ED Diagnoses Final diagnoses:  Viral gastritis  Cyst of ovary, unspecified laterality    Rx / DC Orders ED Discharge Orders          Ordered    ondansetron (ZOFRAN-ODT) 4 MG disintegrating tablet  Every 8 hours PRN        09/30/21 1317    hydrOXYzine (ATARAX) 25 MG tablet  Daily at bedtime        09/30/21 1317              Pricilla Loveless, MD 09/30/21 1524

## 2021-09-30 NOTE — Discharge Instructions (Addendum)
If you develop worsening, continued, or recurrent abdominal pain, uncontrolled vomiting, fever, chest or back pain, or any other new/concerning symptoms then return to the ER for evaluation.  

## 2021-09-30 NOTE — ED Notes (Signed)
Patient transported to Ultrasound 

## 2021-09-30 NOTE — ED Triage Notes (Signed)
Pt to ed via [facility]. C/o abdominal pain, n/v, CP for past 3xDays. Denies diarrhea. Pt states she has been around someone who has been sick. Pt stopped taking hydroxyzine about 3xdays ago.   Per ems: 18G LAC 1L NS 4 zofran

## 2021-09-30 NOTE — ED Notes (Signed)
Patient transported to CT 

## 2021-12-19 DIAGNOSIS — Z681 Body mass index (BMI) 19 or less, adult: Secondary | ICD-10-CM | POA: Diagnosis not present

## 2021-12-19 DIAGNOSIS — Z124 Encounter for screening for malignant neoplasm of cervix: Secondary | ICD-10-CM | POA: Diagnosis not present

## 2021-12-19 DIAGNOSIS — Z760 Encounter for issue of repeat prescription: Secondary | ICD-10-CM | POA: Diagnosis not present

## 2021-12-19 DIAGNOSIS — R102 Pelvic and perineal pain: Secondary | ICD-10-CM | POA: Diagnosis not present

## 2021-12-19 DIAGNOSIS — R87610 Atypical squamous cells of undetermined significance on cytologic smear of cervix (ASC-US): Secondary | ICD-10-CM | POA: Diagnosis not present

## 2021-12-19 DIAGNOSIS — Z01419 Encounter for gynecological examination (general) (routine) without abnormal findings: Secondary | ICD-10-CM | POA: Diagnosis not present

## 2022-01-02 DIAGNOSIS — D279 Benign neoplasm of unspecified ovary: Secondary | ICD-10-CM | POA: Diagnosis not present

## 2022-01-02 DIAGNOSIS — R102 Pelvic and perineal pain: Secondary | ICD-10-CM | POA: Diagnosis not present

## 2022-01-25 ENCOUNTER — Other Ambulatory Visit: Payer: Self-pay | Admitting: Obstetrics and Gynecology

## 2022-01-25 DIAGNOSIS — N9412 Deep dyspareunia: Secondary | ICD-10-CM | POA: Diagnosis not present

## 2022-01-25 DIAGNOSIS — D271 Benign neoplasm of left ovary: Secondary | ICD-10-CM | POA: Diagnosis not present

## 2022-01-25 DIAGNOSIS — R102 Pelvic and perineal pain: Secondary | ICD-10-CM | POA: Diagnosis not present

## 2022-01-25 DIAGNOSIS — D3912 Neoplasm of uncertain behavior of left ovary: Secondary | ICD-10-CM | POA: Diagnosis not present

## 2022-01-25 DIAGNOSIS — N941 Unspecified dyspareunia: Secondary | ICD-10-CM | POA: Diagnosis not present

## 2022-02-06 ENCOUNTER — Telehealth: Payer: Self-pay

## 2022-02-06 NOTE — Telephone Encounter (Signed)
Sending mychart msg. AS, CMA 

## 2023-09-19 ENCOUNTER — Emergency Department (HOSPITAL_COMMUNITY): Payer: Self-pay

## 2023-09-19 ENCOUNTER — Other Ambulatory Visit: Payer: Self-pay

## 2023-09-19 ENCOUNTER — Encounter (HOSPITAL_COMMUNITY): Payer: Self-pay | Admitting: *Deleted

## 2023-09-19 ENCOUNTER — Emergency Department (HOSPITAL_COMMUNITY)
Admission: EM | Admit: 2023-09-19 | Discharge: 2023-09-19 | Disposition: A | Discharge status: Home / Self Care | Payer: Self-pay | Attending: Emergency Medicine | Admitting: Emergency Medicine

## 2023-09-19 DIAGNOSIS — D72829 Elevated white blood cell count, unspecified: Secondary | ICD-10-CM | POA: Insufficient documentation

## 2023-09-19 DIAGNOSIS — E876 Hypokalemia: Secondary | ICD-10-CM | POA: Insufficient documentation

## 2023-09-19 DIAGNOSIS — Z91040 Latex allergy status: Secondary | ICD-10-CM | POA: Insufficient documentation

## 2023-09-19 DIAGNOSIS — K859 Acute pancreatitis without necrosis or infection, unspecified: Secondary | ICD-10-CM | POA: Insufficient documentation

## 2023-09-19 DIAGNOSIS — R112 Nausea with vomiting, unspecified: Secondary | ICD-10-CM

## 2023-09-19 LAB — CBC
HCT: 41.9 % (ref 36.0–46.0)
Hemoglobin: 13.9 g/dL (ref 12.0–15.0)
MCH: 27.6 pg (ref 26.0–34.0)
MCHC: 33.2 g/dL (ref 30.0–36.0)
MCV: 83.3 fL (ref 80.0–100.0)
Platelets: 369 K/uL (ref 150–400)
RBC: 5.03 MIL/uL (ref 3.87–5.11)
RDW: 13.9 % (ref 11.5–15.5)
WBC: 15.9 K/uL — ABNORMAL HIGH (ref 4.0–10.5)
nRBC: 0 % (ref 0.0–0.2)

## 2023-09-19 LAB — URINALYSIS, ROUTINE W REFLEX MICROSCOPIC
Bacteria, UA: NONE SEEN
Bilirubin Urine: NEGATIVE
Glucose, UA: NEGATIVE mg/dL
Ketones, ur: 20 mg/dL — AB
Leukocytes,Ua: NEGATIVE
Nitrite: NEGATIVE
Protein, ur: 30 mg/dL — AB
Specific Gravity, Urine: 1.019 (ref 1.005–1.030)
pH: 8 (ref 5.0–8.0)

## 2023-09-19 LAB — COMPREHENSIVE METABOLIC PANEL WITH GFR
ALT: 19 U/L (ref 0–44)
AST: 20 U/L (ref 15–41)
Albumin: 4.3 g/dL (ref 3.5–5.0)
Alkaline Phosphatase: 37 U/L — ABNORMAL LOW (ref 38–126)
Anion gap: 15 (ref 5–15)
BUN: 15 mg/dL (ref 6–20)
CO2: 24 mmol/L (ref 22–32)
Calcium: 9.8 mg/dL (ref 8.9–10.3)
Chloride: 96 mmol/L — ABNORMAL LOW (ref 98–111)
Creatinine, Ser: 0.68 mg/dL (ref 0.44–1.00)
GFR, Estimated: >60 mL/min (ref >60)
Glucose, Bld: 115 mg/dL — ABNORMAL HIGH (ref 70–99)
Potassium: 3.2 mmol/L — ABNORMAL LOW (ref 3.5–5.1)
Sodium: 135 mmol/L (ref 135–145)
Total Bilirubin: 1 mg/dL (ref 0.0–1.2)
Total Protein: 7.9 g/dL (ref 6.5–8.1)

## 2023-09-19 LAB — PREGNANCY, URINE: Preg Test, Ur: NEGATIVE

## 2023-09-19 LAB — LIPASE, BLOOD: Lipase: 123 U/L — ABNORMAL HIGH (ref 11–51)

## 2023-09-19 LAB — MAGNESIUM: Magnesium: 2.5 mg/dL — ABNORMAL HIGH (ref 1.7–2.4)

## 2023-09-19 LAB — HCG, SERUM, QUALITATIVE: Preg, Serum: NEGATIVE

## 2023-09-19 MED ORDER — FAMOTIDINE IN NACL 20-0.9 MG/50ML-% IV SOLN
20.0000 mg | Freq: Once | INTRAVENOUS | Status: AC
  Administered 2023-09-19: 20 mg via INTRAVENOUS
  Filled 2023-09-19: qty 50

## 2023-09-19 MED ORDER — LIDOCAINE VISCOUS HCL 2 % MT SOLN
15.0000 mL | Freq: Once | OROMUCOSAL | Status: AC
  Administered 2023-09-19: 15 mL via OROMUCOSAL
  Filled 2023-09-19: qty 15

## 2023-09-19 MED ORDER — POTASSIUM CHLORIDE ER 10 MEQ PO TBCR: 10.0000 meq | EXTENDED_RELEASE_TABLET | Freq: Every day | ORAL | 0 refills | Status: AC

## 2023-09-19 MED ORDER — PROMETHAZINE HCL 25 MG RE SUPP: 25.0000 mg | Freq: Three times a day (TID) | RECTAL | 0 refills | Status: AC | PRN

## 2023-09-19 MED ORDER — DROPERIDOL 2.5 MG/ML IJ SOLN
1.2500 mg | Freq: Once | INTRAMUSCULAR | Status: AC
  Administered 2023-09-19: 1.25 mg via INTRAVENOUS
  Filled 2023-09-19: qty 2

## 2023-09-19 MED ORDER — FAMOTIDINE 20 MG PO TABS: 20.0000 mg | ORAL_TABLET | Freq: Two times a day (BID) | ORAL | 1 refills | Status: AC

## 2023-09-19 MED ORDER — ONDANSETRON 4 MG PO TBDP: 4.0000 mg | ORAL_TABLET | Freq: Three times a day (TID) | ORAL | 0 refills | Status: AC | PRN

## 2023-09-19 MED ORDER — ALUM & MAG HYDROXIDE-SIMETH 200-200-20 MG/5ML PO SUSP
30.0000 mL | Freq: Once | ORAL | Status: AC
  Administered 2023-09-19: 30 mL via ORAL
  Filled 2023-09-19: qty 30

## 2023-09-19 MED ORDER — IOHEXOL 300 MG/ML  SOLN
100.0000 mL | Freq: Once | INTRAMUSCULAR | Status: AC | PRN
  Administered 2023-09-19: 100 mL via INTRAVENOUS

## 2023-09-19 MED ORDER — ONDANSETRON HCL 4 MG/2ML IJ SOLN
4.0000 mg | Freq: Once | INTRAMUSCULAR | Status: AC
  Administered 2023-09-19: 4 mg via INTRAVENOUS
  Filled 2023-09-19: qty 2

## 2023-09-19 MED ORDER — SODIUM CHLORIDE 0.9 % IV BOLUS
1000.0000 mL | Freq: Once | INTRAVENOUS | Status: AC
  Administered 2023-09-19: 1000 mL via INTRAVENOUS

## 2023-09-19 NOTE — ED Notes (Signed)
 Patient vomited following PO challenge

## 2023-09-19 NOTE — ED Provider Notes (Signed)
 [place] EMERGENCY DEPARTMENT AT [facility] Provider Note   CSN: [id number] Arrival date & time: 09/19/23  1548     Patient presents with: Abdominal Pain   [name] is a 38 y.o. female presenting to ED with nausea and vomiting.  Patient reports onset of symptoms yesterday.  She says she has been vomiting bile and small clots of blood.  She has not had a bowel movement in 2 days.  She has severe cramping pain across her abdomen.  She says her son was sick with viral URI symptoms with the patient denies coughing, fevers.  She reports a history of ovarian cyst removal, no prior other abdominal surgeries.  She had the same type of symptoms 2 years ago in the ED and was worked up with CT scan at that time which showed the ovarian cyst, no other emergent pathology.  She denies any regular or excessive alcohol consumption.  Denies known history of pancreatitis   HPI     Prior to Admission medications   Medication Sig Start Date End Date Taking? Authorizing Provider  famotidine  (PEPCID ) 20 MG tablet Take 1 tablet (20 mg total) by mouth 2 (two) times daily. 09/19/23 10/19/23 Yes Jenesis Martin, Donnice PARAS, MD  ondansetron  (ZOFRAN -ODT) 4 MG disintegrating tablet Take 1 tablet (4 mg total) by mouth every 8 (eight) hours as needed for up to 15 doses for nausea or vomiting. 09/19/23  Yes Cottie Donnice PARAS, MD  potassium chloride  (KLOR-CON ) 10 MEQ tablet Take 1 tablet (10 mEq total) by mouth daily for 10 days. 09/19/23 09/29/23 Yes Brason Berthelot, Donnice PARAS, MD  promethazine  (PHENERGAN ) 25 MG suppository Place 1 suppository (25 mg total) rectally every 8 (eight) hours as needed for up to 6 doses for nausea or vomiting. 09/19/23  Yes Cottie Donnice PARAS, MD  escitalopram (LEXAPRO) 10 MG tablet Take 10 mg by mouth daily. 05/09/21   [provider]  hydrOXYzine  (ATARAX ) 25 MG tablet Take 1 tablet (25 mg total) by mouth at bedtime. 09/30/21   Freddi Hamilton, MD  ondansetron  (ZOFRAN -ODT) 4 MG disintegrating  tablet Take 1 tablet (4 mg total) by mouth every 8 (eight) hours as needed for nausea or vomiting. 09/30/21   Freddi Hamilton, MD    Allergies: Latex and Xanax  [alprazolam ]    Review of Systems  Updated Vital Signs BP (!) 150/89 (BP Location: Left Arm)   Pulse 70   Temp 98.6 F (37 C) (Oral)   Resp 17   Ht 5' 6 (1.676 m)   Wt 57.6 kg   SpO2 99%   BMI 20.50 kg/m   Physical Exam Constitutional:      General: She is not in acute distress.    Comments: Actively vomiting  HENT:     Head: Normocephalic and atraumatic.  Eyes:     Conjunctiva/sclera: Conjunctivae normal.     Pupils: Pupils are equal, round, and reactive to light.  Cardiovascular:     Rate and Rhythm: Normal rate and regular rhythm.  Pulmonary:     Effort: Pulmonary effort is normal. No respiratory distress.  Abdominal:     General: There is no distension.     Tenderness: There is generalized abdominal tenderness.  Skin:    General: Skin is warm and dry.  Neurological:     General: No focal deficit present.     Mental Status: She is alert. Mental status is at baseline.  Psychiatric:        Mood and Affect: Mood normal.  Behavior: Behavior normal.     (all labs ordered are listed, but only abnormal results are displayed) Labs Reviewed  LIPASE, BLOOD - Abnormal; Notable for the following components:      Result Value   Lipase 123 (*)    All other components within normal limits  COMPREHENSIVE METABOLIC PANEL WITH GFR - Abnormal; Notable for the following components:   Potassium 3.2 (*)    Chloride 96 (*)    Glucose, Bld 115 (*)    Alkaline Phosphatase 37 (*)    All other components within normal limits  CBC - Abnormal; Notable for the following components:   WBC 15.9 (*)    All other components within normal limits  URINALYSIS, ROUTINE W REFLEX MICROSCOPIC - Abnormal; Notable for the following components:   Hgb urine dipstick SMALL (*)    Ketones, ur 20 (*)    Protein, ur 30 (*)    All  other components within normal limits  MAGNESIUM  - Abnormal; Notable for the following components:   Magnesium  2.5 (*)    All other components within normal limits  PREGNANCY, URINE  HCG, SERUM, QUALITATIVE    EKG: None  Radiology: CT ABDOMEN PELVIS W CONTRAST Result Date: 09/19/2023 CLINICAL DATA:  Pancreatitis. EXAM: CT ABDOMEN AND PELVIS WITH CONTRAST TECHNIQUE: Multidetector CT imaging of the abdomen and pelvis was performed using the standard protocol following bolus administration of intravenous contrast. RADIATION DOSE REDUCTION: This exam was performed according to the departmental dose-optimization program which includes automated exposure control, adjustment of the mA and/or kV according to patient size and/or use of iterative reconstruction technique. CONTRAST:  OMNIPAQUE  IOHEXOL  300 MG/ML  SOLN COMPARISON:  CT abdomen and pelvis 10/01/2021. FINDINGS: Lower chest: No acute abnormality. Hepatobiliary: Small peripheral area of hypodensity in the left lobe of the liver is unchanged, possibly focal fatty infiltration. Otherwise, liver, gallbladder and bile ducts are within normal limits. Pancreas: Unremarkable. No pancreatic ductal dilatation or surrounding inflammatory changes. Spleen: Normal in size without focal abnormality. Adrenals/Urinary Tract: Adrenal glands are unremarkable. Kidneys are normal, without renal calculi, focal lesion, or hydronephrosis. Bladder is unremarkable. Stomach/Bowel: Stomach is within normal limits. Appendix is not seen. No evidence of bowel wall thickening, distention, or inflammatory changes. Vascular/Lymphatic: No significant vascular findings are present. No enlarged abdominal or pelvic lymph nodes. Reproductive: Uterus and bilateral adnexa are unremarkable. Other: No abdominal wall hernia or abnormality. No abdominopelvic ascites. Musculoskeletal: No acute or significant osseous findings. IMPRESSION: 1. No acute localizing process in the abdomen or pelvis.  2. No CT evidence for acute pancreatitis. Electronically Signed   By: Greig Pique M.D.   On: 09/19/2023 20:31     Procedures   Medications Ordered in the ED  famotidine  (PEPCID ) IVPB 20 mg premix (0 mg Intravenous Stopped 09/19/23 1834)  sodium chloride  0.9 % bolus 1,000 mL (0 mLs Intravenous Stopped 09/19/23 2030)  droperidol  (INAPSINE ) 2.5 MG/ML injection 1.25 mg (1.25 mg Intravenous Given 09/19/23 1721)  alum & mag hydroxide-simeth (MAALOX/MYLANTA) 200-200-20 MG/5ML suspension 30 mL (30 mLs Oral Given 09/19/23 1725)  lidocaine  (XYLOCAINE ) 2 % viscous mouth solution 15 mL (15 mLs Mouth/Throat Given 09/19/23 1725)  iohexol  (OMNIPAQUE ) 300 MG/ML solution 100 mL (100 mLs Intravenous Contrast Given 09/19/23 1947)  ondansetron  (ZOFRAN ) injection 4 mg (4 mg Intravenous Given 09/19/23 2208)    Clinical Course as of 09/19/23 2305  Wed Sep 19, 2023  2159 Patient is feeling much better and tolerating p.o.  She had a single episode of emesis per  report she tried to check an entire glass of water and cannot have Pepsi, and overdid it.  Since then she has been drinking steadily without any further vomiting.  She prefers to go home and I think this is reasonable.  I will discharge her with Pepcid , potassium, and antiemetics, for her mild pancreatitis.  I suspect this is likely viral in etiology.  I have not identified any emergency process to warrant hospitalization at this time. [MT]    Clinical Course User Index [MT] Eriana Suliman, Donnice PARAS, MD                                 Medical Decision Making Amount and/or Complexity of Data Reviewed Labs: ordered. Radiology: ordered.  Risk OTC drugs. Prescription drug management.   This patient presents to the ED with concern for nausea, vomiting, abdominal pain. This involves an extensive number of treatment options, and is a complaint that carries with it a high risk of complications and morbidity.  The differential diagnosis includes viral gastritis versus  gastroparesis versus pancreatitis versus biliary disease versus other   I ordered and personally interpreted labs.  The pertinent results include: Hypokalemia.  Potassium mildly elevated.  White blood cell count elevated, likely reactive from vomiting.  UA with ketones and protein, no glucose, no evidence of infection.  Creatinine within normal limits.  No significant transaminitis.  Pregnancy negative  I ordered imaging studies including CT abdomen pelvis I independently visualized and interpreted imaging which showed no emergent findings I agree with the radiologist interpretation  The patient was maintained on a cardiac monitor.  I personally viewed and interpreted the cardiac monitored which showed an underlying rhythm of: Sinus rhythm   I ordered medication including IV antiemetics, IV fluids, IV and oral gastritis medications  I have reviewed the patients home medicines and have made adjustments as needed  Test Considered: Doubt pelvic pathology, no indication for pelvic ultrasound at this time  After the interventions noted above, I reevaluated the patient and found that they have: improved  The patient's clinical presentation was most consistent with a viral illness and very mild pancreatitis.  No indication for antibiotics at this time.  She was able to tolerate p.o. and wanting to go home and I thought this was reasonable.  We discussed how to stay hydrated at home.  I did prescribe her several antiemetics and medications to help with her stomach as well as potassium supplements.  Return precautions were discussed.   Dispostion:  After consideration of the diagnostic results and the patients response to treatment, I feel that the patent would benefit from outpatient follow-up      Final diagnoses:  Acute pancreatitis, unspecified complication status, unspecified pancreatitis type  Hypokalemia  Nausea and vomiting, unspecified vomiting type    ED Discharge Orders           Ordered    ondansetron  (ZOFRAN -ODT) 4 MG disintegrating tablet  Every 8 hours PRN        09/19/23 2203    promethazine  (PHENERGAN ) 25 MG suppository  Every 8 hours PRN        09/19/23 2203    potassium chloride  (KLOR-CON ) 10 MEQ tablet  Daily        09/19/23 2203    famotidine  (PEPCID ) 20 MG tablet  2 times daily        09/19/23 2203  Cottie Donnice PARAS, MD 09/19/23 [phone]

## 2023-09-19 NOTE — ED Notes (Signed)
 Patient transported to CT

## 2023-09-19 NOTE — ED Notes (Signed)
 Patient states she is hot and does not feel good. Temperature rechecked at 98.6 oral. Patient asked for a cold rag, and was provided

## 2023-09-19 NOTE — Discharge Instructions (Signed)
 I started you on 2 nausea medications as well as potassium supplements and Pepcid .  You have a very mild case of pancreatitis or inflammation of your pancreas.  This can be caused by many things, including viruses.  I recommend that you follow-up with your primary care clinic to recheck your blood test in 1 week.  Continue to drink plenty of water to stay hydrated at home.  I recommend sticking to a liquid and soft food diet for the next 2 days, avoiding carbohydrates, starchy food, greasy food and fried food.  After that you can ease back into small meals daily.  Try to avoid large meals for the next week, as this can make you feel very bloated and painful.

## 2023-09-19 NOTE — ED Notes (Addendum)
 Patient provided sprite for PO challenge. Updated patient and family that the doctor will be in to update them shortly

## 2023-09-19 NOTE — ED Triage Notes (Signed)
 Pt BIB [facility] for abd pain since yesterday with emesis- reported that pt stated she had vomited blood clots and brown emesis. Reported vitals 125/76 HR 77, CBG 126
